# Patient Record
Sex: Female | Born: 1975 | Race: White | Hispanic: No | Marital: Married | Smoking: Never smoker
Health system: Southern US, Community
[De-identification: ages and names within clinical notes are randomized; demographics above are authoritative.]

## PROBLEM LIST (undated history)

## (undated) DIAGNOSIS — R87629 Unspecified abnormal cytological findings in specimens from vagina: Secondary | ICD-10-CM

## (undated) DIAGNOSIS — A630 Anogenital (venereal) warts: Secondary | ICD-10-CM

## (undated) DIAGNOSIS — F32A Depression, unspecified: Secondary | ICD-10-CM

## (undated) DIAGNOSIS — F419 Anxiety disorder, unspecified: Secondary | ICD-10-CM

## (undated) DIAGNOSIS — F329 Major depressive disorder, single episode, unspecified: Secondary | ICD-10-CM

## (undated) DIAGNOSIS — C801 Malignant (primary) neoplasm, unspecified: Secondary | ICD-10-CM

## (undated) DIAGNOSIS — O039 Complete or unspecified spontaneous abortion without complication: Secondary | ICD-10-CM

## (undated) HISTORY — PX: AUGMENTATION MAMMAPLASTY: SUR837

## (undated) HISTORY — PX: BREAST ENHANCEMENT SURGERY: SHX7

## (undated) HISTORY — PX: COSMETIC SURGERY: SHX468

## (undated) HISTORY — PX: ABDOMINAL HYSTERECTOMY: SHX81

## (undated) HISTORY — DX: Complete or unspecified spontaneous abortion without complication: O03.9

## (undated) HISTORY — DX: Unspecified abnormal cytological findings in specimens from vagina: R87.629

---

## 2001-02-27 HISTORY — PX: BREAST ENHANCEMENT SURGERY: SHX7

## 2011-08-23 ENCOUNTER — Ambulatory Visit: Payer: Self-pay | Admitting: Orthopedic Surgery

## 2011-10-26 ENCOUNTER — Ambulatory Visit: Payer: Self-pay

## 2011-10-26 LAB — CBC WITH DIFFERENTIAL/PLATELET
Basophil #: 0 10*3/uL (ref 0.0–0.1)
Basophil %: 0.3 %
Eosinophil #: 0.1 10*3/uL (ref 0.0–0.7)
Eosinophil %: 0.6 %
HCT: 39.2 % (ref 35.0–47.0)
HGB: 12.9 g/dL (ref 12.0–16.0)
Lymphocyte #: 0.7 10*3/uL — ABNORMAL LOW (ref 1.0–3.6)
Lymphocyte %: 5.1 %
MCH: 29.1 pg (ref 26.0–34.0)
MCHC: 32.9 g/dL (ref 32.0–36.0)
MCV: 89 fL (ref 80–100)
Monocyte #: 1.1 x10 3/mm — ABNORMAL HIGH (ref 0.2–0.9)
Monocyte %: 8.5 %
Neutrophil #: 11 10*3/uL — ABNORMAL HIGH (ref 1.4–6.5)
Neutrophil %: 85.5 %
Platelet: 234 10*3/uL (ref 150–440)
RBC: 4.42 10*6/uL (ref 3.80–5.20)
RDW: 13.9 % (ref 11.5–14.5)
WBC: 12.9 10*3/uL — ABNORMAL HIGH (ref 3.6–11.0)

## 2011-10-26 LAB — COMPREHENSIVE METABOLIC PANEL
Albumin: 3.8 g/dL (ref 3.4–5.0)
Alkaline Phosphatase: 88 U/L (ref 50–136)
Anion Gap: 7 (ref 7–16)
BUN: 7 mg/dL (ref 7–18)
Bilirubin,Total: 0.3 mg/dL (ref 0.2–1.0)
Calcium, Total: 9.3 mg/dL (ref 8.5–10.1)
Chloride: 103 mmol/L (ref 98–107)
Co2: 26 mmol/L (ref 21–32)
Creatinine: 1.04 mg/dL (ref 0.60–1.30)
EGFR (African American): >60
EGFR (Non-African Amer.): >60
Glucose: 110 mg/dL — ABNORMAL HIGH (ref 65–99)
Osmolality: 271 (ref 275–301)
Potassium: 3.8 mmol/L (ref 3.5–5.1)
SGOT(AST): 16 U/L (ref 15–37)
SGPT (ALT): 20 U/L (ref 12–78)
Sodium: 136 mmol/L (ref 136–145)
Total Protein: 7.8 g/dL (ref 6.4–8.2)

## 2011-10-26 LAB — URINALYSIS, COMPLETE
Glucose,UR: NEGATIVE mg/dL (ref 0–75)
Nitrite: NEGATIVE
Protein: NEGATIVE
Specific Gravity: 1.01 (ref 1.003–1.030)

## 2011-10-26 LAB — RAPID STREP-A WITH REFLX: Micro Text Report: NEGATIVE

## 2011-10-28 LAB — URINE CULTURE

## 2011-10-28 LAB — BETA STREP CULTURE(ARMC)

## 2013-08-21 ENCOUNTER — Ambulatory Visit: Payer: Self-pay | Admitting: Obstetrics and Gynecology

## 2014-01-30 LAB — HM PAP SMEAR: HM PAP: NEGATIVE

## 2014-10-15 ENCOUNTER — Encounter: Payer: Self-pay | Admitting: *Deleted

## 2014-10-22 ENCOUNTER — Encounter: Payer: Self-pay | Admitting: Obstetrics and Gynecology

## 2014-10-22 ENCOUNTER — Ambulatory Visit (INDEPENDENT_AMBULATORY_CARE_PROVIDER_SITE_OTHER): Payer: Commercial Managed Care - HMO | Admitting: Obstetrics and Gynecology

## 2014-10-22 VITALS — BP 116/85 | HR 78 | Ht 63.0 in | Wt 138.6 lb

## 2014-10-22 DIAGNOSIS — Z30433 Encounter for removal and reinsertion of intrauterine contraceptive device: Secondary | ICD-10-CM | POA: Diagnosis not present

## 2014-10-22 MED ORDER — HYDROCODONE-ACETAMINOPHEN 10-325 MG PO TABS: 1.0000 | ORAL_TABLET | Freq: Four times a day (QID) | ORAL | Status: DC | PRN

## 2014-10-22 MED ORDER — TRAZODONE HCL 100 MG PO TABS: 100.0000 mg | ORAL_TABLET | Freq: Every day | ORAL | Status: DC

## 2014-10-22 NOTE — Patient Instructions (Signed)
Intrauterine Device Insertion, Care After Refer to this sheet in the next few weeks. These instructions provide you with information on caring for yourself after your procedure. Your health care provider may also give you more specific instructions. Your treatment has been planned according to current medical practices, but problems sometimes occur. Call your health care provider if you have any problems or questions after your procedure. WHAT TO EXPECT AFTER THE PROCEDURE Insertion of the IUD may cause some discomfort, such as cramping. The cramping should improve after the IUD is in place. You may have bleeding after the procedure. This is normal. It varies from light spotting for a few days to menstrual-like bleeding. When the IUD is in place, a string will extend past the cervix into the vagina for 1-2 inches. The strings should not bother you or your partner. If they do, talk to your health care provider.  HOME CARE INSTRUCTIONS   Check your intrauterine device (IUD) to make sure it is in place before you resume sexual activity. You should be able to feel the strings. If you cannot feel the strings, something may be wrong. The IUD may have fallen out of the uterus, or the uterus may have been punctured (perforated) during placement. Also, if the strings are getting longer, it may mean that the IUD is being forced out of the uterus. You no longer have full protection from pregnancy if any of these problems occur.  You may resume sexual intercourse if you are not having problems with the IUD. The copper IUD is considered immediately effective, and the hormone IUD works right away if inserted within 7 days of your period starting. You will need to use a backup method of birth control for 7 days if the IUD in inserted at any other time in your cycle.  Continue to check that the IUD is still in place by feeling for the strings after every menstrual period.  You may need to take pain medicine such as  acetaminophen or ibuprofen. Only take medicines as directed by your health care provider. SEEK MEDICAL CARE IF:   You have bleeding that is heavier or lasts longer than a normal menstrual cycle.  You have a fever.  You have increasing cramps or abdominal pain not relieved with medicine.  You have abdominal pain that does not seem to be related to the same area of earlier cramping and pain.  You are lightheaded, unusually weak, or faint.  You have abnormal vaginal discharge or smells.  You have pain during sexual intercourse.  You cannot feel the IUD strings, or the IUD string has gotten longer.  You feel the IUD at the opening of the cervix in the vagina.  You think you are pregnant, or you miss your menstrual period.  The IUD string is hurting your sex partner. MAKE SURE YOU:  Understand these instructions.  Will watch your condition.  Will get help right away if you are not doing well or get worse. Document Released: 10/12/2010 Document Revised: 12/04/2012 Document Reviewed: 08/04/2012 [facility] Patient Information 2015 [place], [place]. This information is not intended to replace advice given to you by your health care provider. Make sure you discuss any questions you have with your health care provider.

## 2014-10-22 NOTE — Progress Notes (Signed)
Dawn Rivera is a 39 y.o. year old No obstetric history on file. Caucasian female who presents for removal and replacement of a Mirena IUD. She was given informed consent for removal and reinsertion of her Mirena. Her Mirena was placed 2011, No LMP recorded. Patient is not currently having periods (Reason: IUD)., and her pregnancy test today was negative.   The risks and benefits of the method and placement have been thouroughly reviewed with the patient and all questions were answered.  Specifically the patient is aware of failure rate of 02/998, expulsion of the IUD and of possible perforation.  The patient is aware of irregular bleeding due to the method and understands the incidence of irregular bleeding diminishes with time.  Signed copy of informed consent in chart.   No LMP recorded. Patient is not currently having periods (Reason: IUD). BP 116/85 mmHg  Pulse 78  Ht 5' 3"  (1.6 m)  Wt 138 lb 9.6 oz (62.869 kg)  BMI 24.56 kg/m2 No results found for this or any previous visit (from the past 24 hour(s)).   Appropriate time out taken. A graves speculum was placed in the vagina.  The cervix was visualized, prepped using Betadine. The strings were visible. They were grasped and the Mirena was easily removed. The cervix was then grasped with a single-tooth tenaculum. The uterus was found to be anteroflexed and it sounded to 8 cm.  Mirena IUD placed per manufacturer's recommendations without complications. The strings were trimmed to 3 cm.  The patient tolerated the procedure well.   Sonogram was performed and the proper placement of the IUD was verified via transvaginal u/s.   The patient was given post procedure instructions, including signs and symptoms of infection and to check for the strings after each menses or each month, and refraining from intercourse or anything in the vagina for 3 days.  She was given a Mirena care card with date Mirena placed, and date Mirena to be  removed.    Melody Valene Bors, CNM

## 2014-10-26 ENCOUNTER — Telehealth: Payer: Self-pay | Admitting: Obstetrics and Gynecology

## 2014-10-26 NOTE — Telephone Encounter (Signed)
PT CALLED AND WANTED TO KNOW IF YOU COULD CHANGE HER ZOLOFT AND TRIAZADON IN THE 90 DAY SUPPLY INSTEAD OF THE 30.

## 2014-10-27 ENCOUNTER — Other Ambulatory Visit: Payer: Self-pay | Admitting: Obstetrics and Gynecology

## 2014-10-27 MED ORDER — SERTRALINE HCL 100 MG PO TABS: 100.0000 mg | ORAL_TABLET | Freq: Two times a day (BID) | ORAL | Status: DC

## 2014-10-27 MED ORDER — TRAZODONE HCL 100 MG PO TABS: 100.0000 mg | ORAL_TABLET | Freq: Every day | ORAL | Status: DC

## 2014-10-27 NOTE — Telephone Encounter (Signed)
Please verify zoloft dose as I know we questioned wether it was truly 250m daily, and I will send in RX, also can do 90 tablets on trazodone as is is a controlled substance.

## 2014-10-27 NOTE — Telephone Encounter (Signed)
Per [facility] pt is taking zoloft 131m 2 qd, also Trazadone [id number]

## 2015-02-02 ENCOUNTER — Other Ambulatory Visit: Payer: Self-pay | Admitting: Obstetrics and Gynecology

## 2015-02-02 ENCOUNTER — Ambulatory Visit (INDEPENDENT_AMBULATORY_CARE_PROVIDER_SITE_OTHER): Payer: Commercial Managed Care - HMO | Admitting: Obstetrics and Gynecology

## 2015-02-02 ENCOUNTER — Encounter: Payer: Self-pay | Admitting: Obstetrics and Gynecology

## 2015-02-02 VITALS — BP 123/91 | HR 78 | Ht 63.0 in | Wt 142.1 lb

## 2015-02-02 DIAGNOSIS — B001 Herpesviral vesicular dermatitis: Secondary | ICD-10-CM

## 2015-02-02 DIAGNOSIS — Z01419 Encounter for gynecological examination (general) (routine) without abnormal findings: Secondary | ICD-10-CM | POA: Diagnosis not present

## 2015-02-02 MED ORDER — INFLUENZA VAC SPLIT QUAD 0.5 ML IM SUSY
0.5000 mL | PREFILLED_SYRINGE | Freq: Once | INTRAMUSCULAR | Status: AC
  Administered 2015-02-02: 0.5 mL via INTRAMUSCULAR

## 2015-02-02 MED ORDER — VALACYCLOVIR HCL 1 G PO TABS: 1000.0000 mg | ORAL_TABLET | Freq: Every day | ORAL | Status: DC

## 2015-02-02 MED ORDER — ACYCLOVIR 5 % EX OINT: 1.0000 "application " | TOPICAL_OINTMENT | CUTANEOUS | Status: DC

## 2015-02-02 NOTE — Progress Notes (Signed)
Subjective:   Dawn Rivera is a 39 y.o. No obstetric history on file. Caucasian female here for a routine well-woman exam.  No LMP recorded. Patient is not currently having periods (Reason: IUD).    Current complaints: occasional fever blisters- needs refill on medications to treat PCP: none       Does need labs  Social History: Sexual: heterosexual Marital Status: married Living situation: with family Occupation: unknown occupation Tobacco/alcohol: no tobacco use Illicit drugs: no history of illicit drug use  The following portions of the patient's history were reviewed and updated as appropriate: allergies, current medications, past family history, past medical history, past social history, past surgical history and problem list.  Past Medical History Past Medical History  Diagnosis Date  . Vaginal Pap smear, abnormal     Past Surgical History Past Surgical History  Procedure Laterality Date  . Breast enhancement surgery  2003    Gynecologic History No obstetric history on file.  No LMP recorded. Patient is not currently having periods (Reason: IUD). Contraception: IUD Last Pap: 2015. Results were: abnormal Last mammogram: 2015. Results were: normal  Obstetric History OB History  No data available    Current Medications Current Outpatient Prescriptions on File Prior to Visit  Medication Sig Dispense Refill  . fluticasone (FLONASE) 50 MCG/ACT nasal spray Place into both nostrils daily.    Marland Kitchen HYDROcodone-acetaminophen (NORCO) 10-325 MG per tablet Take 1 tablet by mouth every 6 (six) hours as needed. 30 tablet 0  . levonorgestrel (MIRENA) 20 MCG/24HR IUD 1 each by Intrauterine route once.    . loratadine (CLARITIN) 10 MG tablet Take 10 mg by mouth daily.    . sertraline (ZOLOFT) 100 MG tablet Take 1 tablet (100 mg total) by mouth 2 (two) times daily. 180 tablet 2  . traZODone (DESYREL) 100 MG tablet Take 1 tablet (100 mg total) by mouth at bedtime. 30 tablet 2    No current facility-administered medications on file prior to visit.    Review of Systems Patient denies any headaches, blurred vision, shortness of breath, chest pain, abdominal pain, problems with bowel movements, urination, or intercourse.  Objective:  BP 123/91 mmHg  Pulse 78  Ht 5' 3"  (1.6 m)  Wt 142 lb 1.6 oz (64.456 kg)  BMI 25.18 kg/m2 recheck BP 123/80 Physical Exam  General:  Well developed, well nourished, no acute distress. She is alert and oriented x3. Skin:  Warm and dry Neck:  Midline trachea, no thyromegaly or nodules Cardiovascular: Regular rate and rhythm, no murmur heard Lungs:  Effort normal, all lung fields clear to auscultation bilaterally Breasts:  No dominant palpable mass, retraction, or nipple discharge, bilateral implants without discharge Abdomen:  Soft, non tender, no hepatosplenomegaly or masses Pelvic:  External genitalia is normal in appearance.  The vagina is normal in appearance. The cervix is bulbous, no CMT. IUD string noted. Thin prep pap is done with HR HPV cotesting. Uterus is felt to be normal size, shape, and contour.  No adnexal masses or tenderness noted. Extremities:  No swelling or varicosities noted Psych:  She has a normal mood and affect  Assessment:   Healthy well-woman exam IUD survellence Anxiety under control with current SSRI HSV 1   Plan:  Pap and labs obtained Flu Vaccine given F/U 1 year for AE, or sooner if needed Mammogram scheduled  Evanee Lubrano Rockney Ghee, CNM

## 2015-02-02 NOTE — Patient Instructions (Signed)
  Place annual gynecologic exam patient instructions here.  Thank you for enrolling in [facility]. Please follow the instructions below to securely access your online medical record. MyChart allows you to send messages to your doctor, view your test results, manage appointments, and more.   How Do I Sign Up? 1. In your Internet browser, go to [employer] and enter [email or web address]. 2. Click on the Sign Up Now link in the Sign In box. You will see the New Member Sign Up page. 3. Enter your MyChart Access Code exactly as it appears below. You will not need to use this code after you've completed the sign-up process. If you do not sign up before the expiration date, you must request a new code.  MyChart Access Code: [email or web address] Expires: 02/26/2015  2:06 AM  4. Enter your Social Security Number ([id number]) and Date of Birth (mm/dd/yyyy) as indicated and click Submit. You will be taken to the next sign-up page. 5. Create a MyChart ID. This will be your MyChart login ID and cannot be changed, so think of one that is secure and easy to remember. 6. Create a MyChart password. You can change your password at any time. 7. Enter your Password Reset Question and Answer. This can be used at a later time if you forget your password.  8. Enter your e-mail address. You will receive e-mail notification when new information is available in [facility]. 9. Click Sign Up. You can now view your medical record.   Additional Information Remember, MyChart is NOT to be used for urgent needs. For medical emergencies, dial 911.

## 2015-02-03 LAB — COMPREHENSIVE METABOLIC PANEL
A/G RATIO: 1.8 (ref 1.1–2.5)
ALBUMIN: 4.9 g/dL (ref 3.5–5.5)
ALT: 19 IU/L (ref 0–32)
AST: 13 IU/L (ref 0–40)
Alkaline Phosphatase: 77 IU/L (ref 39–117)
BUN / CREAT RATIO: 13 (ref 9–23)
BUN: 12 mg/dL (ref 6–24)
Bilirubin Total: 0.2 mg/dL (ref 0.0–1.2)
CALCIUM: 9.8 mg/dL (ref 8.7–10.2)
CO2: 24 mmol/L (ref 18–29)
Chloride: 101 mmol/L (ref 97–106)
Creatinine, Ser: 0.95 mg/dL (ref 0.57–1.00)
GFR, EST AFRICAN AMERICAN: 86 mL/min/{1.73_m2} (ref >59)
GFR, EST NON AFRICAN AMERICAN: 75 mL/min/{1.73_m2} (ref >59)
GLOBULIN, TOTAL: 2.8 g/dL (ref 1.5–4.5)
Glucose: 76 mg/dL (ref 65–99)
POTASSIUM: 4 mmol/L (ref 3.5–5.2)
SODIUM: 141 mmol/L (ref 136–144)
Total Protein: 7.7 g/dL (ref 6.0–8.5)

## 2015-02-03 LAB — LIPID PANEL
CHOL/HDL RATIO: 3.6 ratio (ref 0.0–4.4)
CHOLESTEROL TOTAL: 167 mg/dL (ref 100–199)
HDL: 46 mg/dL (ref >39)
LDL Calculated: 92 mg/dL (ref 0–99)
TRIGLYCERIDES: 144 mg/dL (ref 0–149)
VLDL Cholesterol Cal: 29 mg/dL (ref 5–40)

## 2015-02-03 LAB — CYTOLOGY - PAP

## 2015-02-03 LAB — VITAMIN D 25 HYDROXY (VIT D DEFICIENCY, FRACTURES): Vit D, 25-Hydroxy: 44.1 ng/mL (ref 30.0–100.0)

## 2015-02-08 ENCOUNTER — Other Ambulatory Visit: Payer: Self-pay | Admitting: Obstetrics and Gynecology

## 2015-02-09 ENCOUNTER — Other Ambulatory Visit: Payer: Self-pay | Admitting: Obstetrics and Gynecology

## 2015-02-09 DIAGNOSIS — IMO0002 Reserved for concepts with insufficient information to code with codable children: Secondary | ICD-10-CM

## 2015-02-10 ENCOUNTER — Other Ambulatory Visit: Payer: Self-pay | Admitting: Obstetrics and Gynecology

## 2015-02-10 MED ORDER — HYDROCODONE-ACETAMINOPHEN 10-325 MG PO TABS: 1.0000 | ORAL_TABLET | Freq: Four times a day (QID) | ORAL | Status: DC | PRN

## 2015-02-11 ENCOUNTER — Encounter: Payer: Self-pay | Admitting: Obstetrics and Gynecology

## 2015-02-16 ENCOUNTER — Encounter: Payer: Self-pay | Admitting: Obstetrics and Gynecology

## 2015-03-03 ENCOUNTER — Other Ambulatory Visit: Payer: Self-pay | Admitting: Obstetrics and Gynecology

## 2015-03-05 ENCOUNTER — Encounter: Payer: Commercial Managed Care - HMO | Admitting: Obstetrics and Gynecology

## 2015-03-19 ENCOUNTER — Other Ambulatory Visit: Payer: Self-pay | Admitting: Obstetrics and Gynecology

## 2015-03-19 ENCOUNTER — Ambulatory Visit (INDEPENDENT_AMBULATORY_CARE_PROVIDER_SITE_OTHER): Payer: Managed Care, Other (non HMO) | Admitting: Obstetrics and Gynecology

## 2015-03-19 ENCOUNTER — Encounter: Payer: Self-pay | Admitting: Obstetrics and Gynecology

## 2015-03-19 VITALS — BP 102/80 | HR 88 | Ht 63.0 in | Wt 141.8 lb

## 2015-03-19 DIAGNOSIS — N921 Excessive and frequent menstruation with irregular cycle: Secondary | ICD-10-CM | POA: Diagnosis not present

## 2015-03-19 MED ORDER — VALACYCLOVIR HCL 1 G PO TABS: 1000.0000 mg | ORAL_TABLET | Freq: Every day | ORAL | Status: DC

## 2015-03-19 NOTE — Patient Instructions (Signed)
COLPOSCOPY POST-PROCEDURE INSTRUCTIONS  1. You may take Ibuprofen, Aleve or Tylenol for cramping if needed.  2. If Monsel's solution was used, you will have a black discharge.  3. Light bleeding is normal.  If bleeding is heavier than your period, please call.  4. Put nothing in your vagina until the bleeding or discharge stops (usually 2 or3 days).  5. We will call you within one week with biopsy results or discuss the results at your follow-up appointment if needed

## 2015-03-19 NOTE — Progress Notes (Signed)
Patient ID: Dawn Rivera, female   DOB: [birth year], 40 y.o.   MRN: [id number]  Chief Complaint  Patient presents with  . Colposcopy    abnormal pap, 02/02/15    HPI Dawn Rivera is a 40 y.o. female.  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 Previous colposcopy: CIN 2. Prior cervical treatment: no treatment.  Past Medical History  Diagnosis Date  . Vaginal Pap smear, abnormal     Past Surgical History  Procedure Laterality Date  . Breast enhancement surgery  2003    Family History  Problem Relation Age of Onset  . Cancer Maternal Aunt     breast    Social History Social History  Substance Use Topics  . Smoking status: Never Smoker   . Smokeless tobacco: Never Used  . Alcohol Use: Yes     Comment: occas    No Known Allergies  Current Outpatient Prescriptions  Medication Sig Dispense Refill  . acyclovir ointment (ZOVIRAX) 5 % Apply 1 application topically every 3 (three) hours. 15 g 2  . fluticasone (FLONASE) 50 MCG/ACT nasal spray Place into both nostrils daily.    Marland Kitchen HYDROcodone-acetaminophen (NORCO) 10-325 MG tablet Take 1 tablet by mouth every 6 (six) hours as needed. 30 tablet 0  . levonorgestrel (MIRENA) 20 MCG/24HR IUD 1 each by Intrauterine route once.    . loratadine (CLARITIN) 10 MG tablet Take 10 mg by mouth daily.    . sertraline (ZOLOFT) 100 MG tablet Take 1 tablet (100 mg total) by mouth 2 (two) times daily. 180 tablet 2  . traZODone (DESYREL) 100 MG tablet Take 1 tablet (100 mg total) by mouth at bedtime. 30 tablet 2  . valACYclovir (VALTREX) 1000 MG tablet Take 1 tablet (1,000 mg total) by mouth daily. 90 tablet 2   No current facility-administered medications for this visit.    Review of Systems Review of  Systems  Blood pressure 102/80, pulse 88, height 5' 3"  (1.6 m), weight 141 lb 12.8 oz (64.32 kg).  Physical Exam Physical Exam  Data Reviewed pap  Assessment    Procedure Details  The risks and benefits of the procedure and Written informed consent obtained.  Speculum placed in vagina and excellent visualization of cervix achieved, cervix swabbed x 3 with acetic acid solution.  Specimens: 2 & 4 & 8 o'clock biopsies and ECC- see scan graph  Complications: none.     Plan    Specimens labelled and sent to Pathology. Return to discuss Pathology results in 2 weeks. Discussed possible hysterectomy to treat menorrhagia and cervical dysplasia. Rx sent in for percocet 5/325 #40 for migraines.      Dawn Rivera N Dawn Rivera 03/19/2015, 5:07 PM

## 2015-03-25 ENCOUNTER — Encounter: Payer: Self-pay | Admitting: Obstetrics and Gynecology

## 2015-03-25 ENCOUNTER — Ambulatory Visit (INDEPENDENT_AMBULATORY_CARE_PROVIDER_SITE_OTHER): Payer: Managed Care, Other (non HMO) | Admitting: Obstetrics and Gynecology

## 2015-03-25 VITALS — BP 128/77 | HR 71 | Ht 63.0 in | Wt 142.6 lb

## 2015-03-25 DIAGNOSIS — D069 Carcinoma in situ of cervix, unspecified: Secondary | ICD-10-CM

## 2015-03-25 DIAGNOSIS — N921 Excessive and frequent menstruation with irregular cycle: Secondary | ICD-10-CM | POA: Insufficient documentation

## 2015-03-25 NOTE — Progress Notes (Signed)
Chief complaint: 1.  High-grade dysplasia. 2.  Menorrhagia.  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 Patient is desiring hysterectomy for management of her abnormal uterine bleeding as well as her cervical dysplasia. Currently, Mirena IUD is intact and patient is not having cycles with the device in place.  Pathology:  Cervix 8:00-CIN-2.  Cervix 4:00-CIN-2.  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  ECC-CIN-3/CIS  Findings from colposcopic directed biopsies were reviewed and explained.  ASSESSMENT: 1.  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  PLAN: 1.  LEEP cone biopsy of the cervix. 2.  Probable hysterectomy for management of abnormal uterine bleeding and cervical dysplasia.  Once pathology from LEEP cone biopsy is obtained.  A total of 25 minutes were spent face-to-face with the patient during this encounter and over half of that time involved counseling and coordination of care.  Brayton Mars, MD  Note: This dictation was prepared with Dragon dictation along with smaller phrase technology. Any transcriptional errors that result from this process are unintentional.

## 2015-03-25 NOTE — Patient Instructions (Signed)
1.  LEEP cone biopsy will be scheduled to treat/assess CIN-3/CIS of cervix. 2.  Return in 2 weeks for preop appointment Conization of the Cervix Cervical conization is the cutting (excision) of a cone-shaped portion of the cervix. The procedure is performed through the vagina in either your health care provider's office or an operating room. This procedure is usually done when there is abnormal bleeding from the cervix. It can also be done to evaluate an abnormal Pap test or if an abnormality is seen on the cervix during an exam. The tissue is then examined to see if there are precancerous cells or cancer present.  Conization of the cervix is not done during a menstrual period or pregnancy.  LET [facility] CARE PROVIDER KNOW ABOUT:  Any allergies you have.   All medicines you are taking, including vitamins, herbs, eye drops, creams, and over-the-counter medicines.   Previous problems you or members of your family have had with the use of anesthetics.   Any blood disorders you have.   Previous surgeries you have had.   Medical conditions you have.   Your smoking habits.   The possibility of being pregnant.  RISKS AND COMPLICATIONS  Generally, conization of the cervix is a safe procedure. However, as with any procedure, complications can occur. Possible complications include:  Heavy bleeding several days or weeks after the procedure. Light bleeding or spotting after the procedure is normal.  Infection (rare).  Damage to the cervix or surrounding organs (uncommon).   Problems with the anesthesia.   Increased risk of preterm labor in future pregnancies. BEFORE THE PROCEDURE  Do not eat or drink anything for 6-8 hours before the procedure.   Do not take aspirin or blood thinners for at least a week before the procedure or as directed by your health care provider.   Arrange for someone to take you home after the procedure.  PROCEDURE There are three different methods  to perform conization of the cervix. These include:   The cold knife method. In this method a small cone-shaped sample of tissue is cut out with a knife (scalpel) from the cervical canal and the transformation zone (where the normal cells end and the abnormal cells begin).   The LEEP method. In this method a small cone-shaped sample of tissue is cut out with a thin wire that can burn (cauterize) the cervical tissue with an electrical current.   Laser treatment. In this method a small cone-shaped sample of tissue is cut out and then cauterized with a laser beam to prevent bleeding.  The procedure will be performed as follows:   Depending on the method, you will either be given a medicine to make you sleep (general anesthetic) or a numbing medicine (local anesthetic). A medicine that numbs the cervix (cervical block) may be given.   A lubricated device called a speculum will be inserted into the vagina to spread open the walls of the vagina. This will help your health care provider see the inside of the vagina and cervix better.   The tissue from the cervix will be removed and examined.   The results of the procedure will help your health care provider decide if further treatment is necessary. They will also help your health care provider decide on the best treatment if your results are abnormal. AFTER THE PROCEDURE  If you had a general anesthetic, you may be groggy for 2-3 hours after the procedure.   If you had a local anesthetic, you will  rest at the clinic or hospital until you are stable and feel ready to go home.   Recovery may take up to 3 weeks.   You may have some cramping for about 1 week.   You may have bloody discharge or light bleeding for 1-2 weeks.   You may have black discharge coming from the vagina. This is from the paste used on the cervix to prevent bleeding. This is normal discharge.    This information is not intended to replace advice given to you by  your health care provider. Make sure you discuss any questions you have with your health care provider.   Document Released: 11/23/2004 Document Revised: 02/18/2013 Document Reviewed: 08/09/2012 Elsevier Interactive Patient Education [employer].

## 2015-03-28 ENCOUNTER — Encounter: Payer: Self-pay | Admitting: Obstetrics and Gynecology

## 2015-03-29 ENCOUNTER — Encounter: Payer: Self-pay | Admitting: Obstetrics and Gynecology

## 2015-04-08 ENCOUNTER — Other Ambulatory Visit: Payer: Self-pay | Admitting: *Deleted

## 2015-04-08 ENCOUNTER — Other Ambulatory Visit: Payer: Self-pay | Admitting: Obstetrics and Gynecology

## 2015-04-08 ENCOUNTER — Ambulatory Visit
Admission: RE | Admit: 2015-04-08 | Discharge: 2015-04-08 | Disposition: A | Discharge status: Home / Self Care | Payer: Managed Care, Other (non HMO) | Source: Ambulatory Visit | Attending: Obstetrics and Gynecology | Admitting: Obstetrics and Gynecology

## 2015-04-08 ENCOUNTER — Encounter: Payer: Self-pay | Admitting: Obstetrics and Gynecology

## 2015-04-08 ENCOUNTER — Telehealth: Payer: Self-pay | Admitting: Obstetrics and Gynecology

## 2015-04-08 ENCOUNTER — Ambulatory Visit (INDEPENDENT_AMBULATORY_CARE_PROVIDER_SITE_OTHER): Payer: Managed Care, Other (non HMO) | Admitting: Obstetrics and Gynecology

## 2015-04-08 ENCOUNTER — Encounter
Admission: RE | Admit: 2015-04-08 | Discharge: 2015-04-08 | Disposition: A | Discharge status: Home / Self Care | Payer: Managed Care, Other (non HMO) | Source: Ambulatory Visit | Attending: Obstetrics and Gynecology | Admitting: Obstetrics and Gynecology

## 2015-04-08 VITALS — BP 117/77 | HR 83 | Ht 63.0 in | Wt 143.6 lb

## 2015-04-08 DIAGNOSIS — D069 Carcinoma in situ of cervix, unspecified: Secondary | ICD-10-CM

## 2015-04-08 DIAGNOSIS — Z0189 Encounter for other specified special examinations: Secondary | ICD-10-CM | POA: Insufficient documentation

## 2015-04-08 DIAGNOSIS — Z01419 Encounter for gynecological examination (general) (routine) without abnormal findings: Secondary | ICD-10-CM

## 2015-04-08 DIAGNOSIS — Z1231 Encounter for screening mammogram for malignant neoplasm of breast: Secondary | ICD-10-CM | POA: Diagnosis present

## 2015-04-08 DIAGNOSIS — Z01818 Encounter for other preprocedural examination: Secondary | ICD-10-CM

## 2015-04-08 HISTORY — DX: Anogenital (venereal) warts: A63.0

## 2015-04-08 HISTORY — DX: Major depressive disorder, single episode, unspecified: F32.9

## 2015-04-08 HISTORY — DX: Anxiety disorder, unspecified: F41.9

## 2015-04-08 HISTORY — DX: Depression, unspecified: F32.A

## 2015-04-08 LAB — CBC WITH DIFFERENTIAL/PLATELET
BASOS ABS: 0.1 10*3/uL (ref 0–0.1)
BASOS PCT: 1 %
EOS ABS: 0.4 10*3/uL (ref 0–0.7)
EOS PCT: 4 %
HCT: 42.8 % (ref 35.0–47.0)
Hemoglobin: 13.9 g/dL (ref 12.0–16.0)
Lymphocytes Relative: 21 %
Lymphs Abs: 2.1 10*3/uL (ref 1.0–3.6)
MCH: 28.3 pg (ref 26.0–34.0)
MCHC: 32.6 g/dL (ref 32.0–36.0)
MCV: 87 fL (ref 80.0–100.0)
MONO ABS: 0.6 10*3/uL (ref 0.2–0.9)
Monocytes Relative: 6 %
Neutro Abs: 6.7 10*3/uL — ABNORMAL HIGH (ref 1.4–6.5)
Neutrophils Relative %: 68 %
PLATELETS: 348 10*3/uL (ref 150–440)
RBC: 4.92 MIL/uL (ref 3.80–5.20)
RDW: 13.8 % (ref 11.5–14.5)
WBC: 9.9 10*3/uL (ref 3.6–11.0)

## 2015-04-08 LAB — RAPID HIV SCREEN (HIV 1/2 AB+AG)
HIV 1/2 Antibodies: NONREACTIVE
HIV-1 P24 ANTIGEN - HIV24: NONREACTIVE

## 2015-04-08 MED ORDER — SERTRALINE HCL 100 MG PO TABS: 100.0000 mg | ORAL_TABLET | Freq: Two times a day (BID) | ORAL | Status: DC

## 2015-04-08 NOTE — Pre-Procedure Instructions (Signed)
Order for urine pregnancy will be done as a POCT the day of surgery.  Can we order Pepcid as per anesthesia standing orders instead of citric acid-sodium citrate.  That is not is the pixis in Same Day Surgery.

## 2015-04-08 NOTE — Telephone Encounter (Signed)
preadmit testing called about orders Dr Tennis Must put in for her.  Please call

## 2015-04-08 NOTE — Progress Notes (Signed)
GYN ENCOUNTER NOTE  Subjective:       Dawn Rivera is a 40 y.o. 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  Colposcopy pathology:  Cervix 8:00-CIN-2. Cervix 4:00-CIN-2. 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 Denies any N/V/D/C, abd pain.    Gynecologic History Patient's last menstrual period was 03/17/2015 (approximate). Contraception: IUD   Obstetric History OB History  Gravida Para Term Preterm AB SAB TAB Ectopic Multiple Living  4 3 3  1 1    3     # Outcome Date GA Lbr Len/2nd Weight Sex Delivery Anes PTL Lv  4 Term 2003   7 lb 4.8 oz (3.311 kg) M Vag-Spont   Y  3 SAB 2002          2 Term 1999   7 lb 8 oz (3.402 kg) F Vag-Spont   Y  1 Term 1995   6 lb 4.8 oz (2.858 kg) M Vag-Spont   Y      Past Medical History  Diagnosis Date  . Vaginal Pap smear, abnormal     Past Surgical History  Procedure Laterality Date  . Breast enhancement surgery  2003    Current Outpatient Prescriptions on File Prior to Visit  Medication Sig Dispense Refill  . acyclovir ointment (ZOVIRAX) 5 % Apply 1 application topically every 3 (three) hours. 15 g 2  . fluticasone (FLONASE) 50 MCG/ACT nasal spray Place into both nostrils daily.    Marland Kitchen levonorgestrel (MIRENA) 20 MCG/24HR IUD 1 each by Intrauterine route once.    . loratadine (CLARITIN) 10 MG tablet Take 10 mg by mouth daily.    . Multiple Vitamins-Minerals (MULTIVITAMIN WITH MINERALS) tablet Take 1 tablet by mouth daily.    Marland Kitchen oxyCODONE-acetaminophen (PERCOCET/ROXICET) 5-325 MG tablet     . sertraline (ZOLOFT) 100 MG tablet Take 1 tablet (100 mg total) by mouth 2 (two) times daily. 180 tablet 2  . traZODone (DESYREL) 100 MG tablet Take 1 tablet (100 mg total) by mouth at bedtime. 30 tablet 2  . valACYclovir (VALTREX) 1000 MG tablet Take 1 tablet (1,000 mg total) by mouth daily. 90 tablet 2  . XANAX 1 MG tablet      No current facility-administered medications  on file prior to visit.    No Known Allergies  Social History   Social History  . Marital Status: Married    Spouse Name: N/A  . Number of Children: N/A  . Years of Education: N/A   Occupational History  . Not on file.   Social History Main Topics  . Smoking status: Never Smoker   . Smokeless tobacco: Never Used  . Alcohol Use: Yes     Comment: occas  . Drug Use: No  . Sexual Activity: Yes    Birth Control/ Protection: IUD   Other Topics Concern  . Not on file   Social History Narrative    Family History  Problem Relation Age of Onset  . Breast cancer Maternal Aunt   . Heart disease Mother   . Colon cancer Maternal Grandmother   . Ovarian cancer Maternal Grandfather   . Diabetes Neg Hx     The following portions of the patient's history were reviewed and updated as appropriate: allergies, current medications, past family history, past medical history, past social history, past surgical history and problem list.  Review of Systems Review of Systems - General ROS: negative for - chills, fatigue, fever, hot flashes, malaise or night  sweats Hematological and Lymphatic ROS: negative for - bleeding problems or swollen lymph nodes Gastrointestinal ROS: negative for - abdominal pain, blood in stools, change in bowel habits and nausea/vomiting Musculoskeletal ROS: negative for - joint pain, muscle pain or muscular weakness Genito-Urinary ROS: negative for - change in menstrual cycle, dysmenorrhea, dyspareunia, dysuria, genital discharge, genital ulcers, hematuria, incontinence, irregular/heavy menses, nocturia or pelvic pain  Objective:   BP 117/77 mmHg  Pulse 83  Ht 5' 3"  (1.6 m)  Wt 143 lb 9.6 oz (65.137 kg)  BMI 25.44 kg/m2  LMP 03/17/2015 (Approximate) CONSTITUTIONAL: Well-developed, well-nourished female in no acute distress.  HENT:  Normocephalic, atraumatic.  NECK: Normal range of motion, supple, no masses.  Normal thyroid.  SKIN: Skin is warm and dry. No  rash noted. Not diaphoretic. No erythema. No pallor. [place]: Alert and oriented to person, place, and time.  PSYCHIATRIC: Normal mood and affect. Normal behavior. Normal judgment and thought content. CARDIOVASCULAR: Normal S1, S2. No m/g/r RESPIRATORY: Clear to auscultation bilaterally.  BREASTS: Not Examined ABDOMEN: Deferred till OR PELVIC: Not Examined  MUSCULOSKELETAL: Not Examined      Assessment:   1. 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  2. H/o Abnormal uterine bleeding - Patient desires hysterectomy as definitve treatment   Plan:  1.  LEEP cone biopsy. 2.  Removal of Mirena IUD  Preop counseling: Patient is to undergo LEEP cone biopsy for CIN 3/CDIS on ECC specimen.  She is understanding of the planned procedure and is aware of and is accepting of all surgical risks which include but are not limited to bleeding, infection, pelvic organ injury with need for repair, blood clot disorders, anesthesia risks, etc.  All questions have been answered.  Informed consent is given.  Patient is ready and willing to proceed with surgery as scheduled. Patient is to have Mirena IUD removed at the time of the LEEP.  Cathlean Sauer, PA-S Brayton Mars, MD   I have seen, interviewed, and examined the patient in conjunction with the [facility].A. student and affirm the diagnosis and management plan. Martin A. DeFrancesco, MD, FACOG   Note: This dictation was prepared with Dragon dictation along with smaller phrase technology. Any transcriptional errors that result from this process are unintentional.

## 2015-04-08 NOTE — Patient Instructions (Signed)
  Your procedure is scheduled on: 04/12/15 Report to Day Surgery. To find out your arrival time please call [phone] between 1PM - 3PM on 03/09/15  Remember: Instructions that are not followed completely may result in serious medical risk, up to and including death, or upon the discretion of your surgeon and anesthesiologist your surgery may need to be rescheduled.    __x__ 1. Do not eat food or drink liquids after midnight. No gum chewing or hard candies.     __x__ 2. No Alcohol for 24 hours before or after surgery.   ____ 3. Bring all medications with you on the day of surgery if instructed.    __x__ 4. Notify your doctor if there is any change in your medical condition     (cold, fever, infections).     Do not wear jewelry, make-up, hairpins, clips or nail polish.  Do not wear lotions, powders, or perfumes. You may wear deodorant.  Do not shave 48 hours prior to surgery. Men may shave face and neck.  Do not bring valuables to the hospital.    [facility] is not responsible for any belongings or valuables.               Contacts, dentures or bridgework may not be worn into surgery.  Leave your suitcase in the car. After surgery it may be brought to your room.  For patients admitted to the hospital, discharge time is determined by your                treatment team.   Patients discharged the day of surgery will not be allowed to drive home.   Please read over the following fact sheets that you were given:   Surgical Site Infection Prevention   ____ Take these medicines the morning of surgery with A SIP OF WATER:    1.   2.   3.   4.  5.  6.  ____ Fleet Enema (as directed)   ____ Use CHG Soap as directed  ____ Use inhalers on the day of surgery  ____ Stop metformin 2 days prior to surgery    ____ Take 1/2 of usual insulin dose the night before surgery and none on the morning of surgery.   ____ Stop Coumadin/Plavix/aspirin on  __x__ Stop Anti-inflammatories on  tylenol only until surgery   ___x_ Stop supplements until after surgery.    ____ Bring C-Pap to the hospital.

## 2015-04-09 LAB — RPR: RPR Ser Ql: NONREACTIVE

## 2015-04-11 NOTE — Patient Instructions (Signed)
1.  Return in 1 week for postop check

## 2015-04-11 NOTE — Progress Notes (Signed)
GYN ENCOUNTER NOTE  Subjective:       Dawn Rivera is a 40 y.o. 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  Colposcopy pathology:  Cervix 8:00-CIN-2. Cervix 4:00-CIN-2. 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 Denies any N/V/D/C, abd pain.    Gynecologic History Patient's last menstrual period was 03/17/2015 (approximate). Contraception: IUD   Obstetric History OB History  Gravida Para Term Preterm AB SAB TAB Ectopic Multiple Living  4 3 3  1 1    3     # Outcome Date GA Lbr Len/2nd Weight Sex Delivery Anes PTL Lv  4 Term 2003   7 lb 4.8 oz (3.311 kg) M Vag-Spont   Y  3 SAB 2002          2 Term 1999   7 lb 8 oz (3.402 kg) F Vag-Spont   Y  1 Term 1995   6 lb 4.8 oz (2.858 kg) M Vag-Spont   Y      Past Medical History  Diagnosis Date  . Vaginal Pap smear, abnormal   . Depression   . Anxiety   . HPV (human papilloma virus) anogenital infection     Past Surgical History  Procedure Laterality Date  . Breast enhancement surgery  2003  . Augmentation mammaplasty Bilateral     breast implants    No current facility-administered medications on file prior to encounter.   Current Outpatient Prescriptions on File Prior to Encounter  Medication Sig Dispense Refill  . acyclovir ointment (ZOVIRAX) 5 % Apply 1 application topically every 3 (three) hours. 15 g 2  . fluticasone (FLONASE) 50 MCG/ACT nasal spray Place into both nostrils daily.    Marland Kitchen levonorgestrel (MIRENA) 20 MCG/24HR IUD 1 each by Intrauterine route once.    . loratadine (CLARITIN) 10 MG tablet Take 10 mg by mouth daily.    . Multiple Vitamins-Minerals (MULTIVITAMIN WITH MINERALS) tablet Take 1 tablet by mouth daily.    Marland Kitchen oxyCODONE-acetaminophen (PERCOCET/ROXICET) 5-325 MG tablet     . traZODone (DESYREL) 100 MG tablet Take 1 tablet (100 mg total) by mouth at bedtime. 30 tablet 2  . valACYclovir (VALTREX) 1000 MG tablet Take 1 tablet (1,000  mg total) by mouth daily. 90 tablet 2  . XANAX 1 MG tablet       No Known Allergies  Social History   Social History  . Marital Status: Married    Spouse Name: N/A  . Number of Children: N/A  . Years of Education: N/A   Occupational History  . Not on file.   Social History Main Topics  . Smoking status: Never Smoker   . Smokeless tobacco: Never Used  . Alcohol Use: Yes     Comment: occas  . Drug Use: No  . Sexual Activity: Yes    Birth Control/ Protection: IUD   Other Topics Concern  . Not on file   Social History Narrative    Family History  Problem Relation Age of Onset  . Breast cancer Maternal Aunt   . Heart disease Mother   . Colon cancer Maternal Grandmother   . Ovarian cancer Maternal Grandfather   . Diabetes Neg Hx     The following portions of the patient's history were reviewed and updated as appropriate: allergies, current medications, past family history, past medical history, past social history, past surgical history and problem list.  Review of Systems Review of Systems - General ROS: negative for - chills, fatigue,  fever, hot flashes, malaise or night sweats Hematological and Lymphatic ROS: negative for - bleeding problems or swollen lymph nodes Gastrointestinal ROS: negative for - abdominal pain, blood in stools, change in bowel habits and nausea/vomiting Musculoskeletal ROS: negative for - joint pain, muscle pain or muscular weakness Genito-Urinary ROS: negative for - change in menstrual cycle, dysmenorrhea, dyspareunia, dysuria, genital discharge, genital ulcers, hematuria, incontinence, irregular/heavy menses, nocturia or pelvic pain  Objective:   LMP 03/17/2015 (Approximate) CONSTITUTIONAL: Well-developed, well-nourished female in no acute distress.  HENT:  Normocephalic, atraumatic.  NECK: Normal range of motion, supple, no masses.  Normal thyroid.  SKIN: Skin is warm and dry. No rash noted. Not diaphoretic. No erythema. No  pallor. [place]: Alert and oriented to person, place, and time.  PSYCHIATRIC: Normal mood and affect. Normal behavior. Normal judgment and thought content. CARDIOVASCULAR: Normal S1, S2. No m/g/r RESPIRATORY: Clear to auscultation bilaterally.  BREASTS: Not Examined ABDOMEN: Deferred till OR PELVIC: Not Examined  MUSCULOSKELETAL: Not Examined      Assessment:   1. 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  2. H/o Abnormal uterine bleeding - Patient desires hysterectomy as definitve treatment   Plan:  1.  LEEP cone biopsy. 2.  Removal of Mirena IUD  Preop counseling: Patient is to undergo LEEP cone biopsy for CIN 3/CDIS on ECC specimen.  She is understanding of the planned procedure and is aware of and is accepting of all surgical risks which include but are not limited to bleeding, infection, pelvic organ injury with need for repair, blood clot disorders, anesthesia risks, etc.  All questions have been answered.  Informed consent is given.  Patient is ready and willing to proceed with surgery as scheduled. Patient is to have Mirena IUD removed at the time of the LEEP.  Cathlean Sauer, PA-S Brayton Mars, MD   I have seen, interviewed, and examined the patient in conjunction with the [facility].A. student and affirm the diagnosis and management plan. Davyon Fisch A. Langston [place], MD, FACOG   Note: This dictation was prepared with Dragon dictation along with smaller phrase technology. Any transcriptional errors that result from this process are unintentional.

## 2015-04-12 ENCOUNTER — Ambulatory Visit
Admission: RE | Admit: 2015-04-12 | Discharge: 2015-04-12 | Disposition: A | Discharge status: Home / Self Care | Payer: Managed Care, Other (non HMO) | Source: Ambulatory Visit | Attending: Obstetrics and Gynecology | Admitting: Obstetrics and Gynecology

## 2015-04-12 ENCOUNTER — Encounter
Admission: RE | Disposition: A | Discharge status: Home / Self Care | Payer: Self-pay | Source: Ambulatory Visit | Attending: Obstetrics and Gynecology

## 2015-04-12 ENCOUNTER — Ambulatory Visit: Payer: Managed Care, Other (non HMO) | Admitting: Anesthesiology

## 2015-04-12 ENCOUNTER — Encounter: Payer: Self-pay | Admitting: *Deleted

## 2015-04-12 DIAGNOSIS — Z8041 Family history of malignant neoplasm of ovary: Secondary | ICD-10-CM | POA: Diagnosis not present

## 2015-04-12 DIAGNOSIS — Z30432 Encounter for removal of intrauterine contraceptive device: Secondary | ICD-10-CM | POA: Diagnosis not present

## 2015-04-12 DIAGNOSIS — Z9882 Breast implant status: Secondary | ICD-10-CM | POA: Diagnosis not present

## 2015-04-12 DIAGNOSIS — Z79899 Other long term (current) drug therapy: Secondary | ICD-10-CM | POA: Diagnosis not present

## 2015-04-12 DIAGNOSIS — Z79891 Long term (current) use of opiate analgesic: Secondary | ICD-10-CM | POA: Insufficient documentation

## 2015-04-12 DIAGNOSIS — D061 Carcinoma in situ of exocervix: Secondary | ICD-10-CM | POA: Insufficient documentation

## 2015-04-12 DIAGNOSIS — F419 Anxiety disorder, unspecified: Secondary | ICD-10-CM | POA: Insufficient documentation

## 2015-04-12 DIAGNOSIS — N939 Abnormal uterine and vaginal bleeding, unspecified: Secondary | ICD-10-CM | POA: Insufficient documentation

## 2015-04-12 DIAGNOSIS — Z800 Family history of malignant neoplasm of digestive organs: Secondary | ICD-10-CM | POA: Insufficient documentation

## 2015-04-12 DIAGNOSIS — Z7951 Long term (current) use of inhaled steroids: Secondary | ICD-10-CM | POA: Diagnosis not present

## 2015-04-12 DIAGNOSIS — D069 Carcinoma in situ of cervix, unspecified: Secondary | ICD-10-CM | POA: Diagnosis not present

## 2015-04-12 DIAGNOSIS — N920 Excessive and frequent menstruation with regular cycle: Secondary | ICD-10-CM | POA: Insufficient documentation

## 2015-04-12 DIAGNOSIS — Z803 Family history of malignant neoplasm of breast: Secondary | ICD-10-CM | POA: Insufficient documentation

## 2015-04-12 DIAGNOSIS — A630 Anogenital (venereal) warts: Secondary | ICD-10-CM | POA: Insufficient documentation

## 2015-04-12 DIAGNOSIS — F329 Major depressive disorder, single episode, unspecified: Secondary | ICD-10-CM | POA: Diagnosis not present

## 2015-04-12 DIAGNOSIS — R87613 High grade squamous intraepithelial lesion on cytologic smear of cervix (HGSIL): Secondary | ICD-10-CM | POA: Diagnosis present

## 2015-04-12 DIAGNOSIS — Z86001 Personal history of in-situ neoplasm of cervix uteri: Secondary | ICD-10-CM | POA: Diagnosis not present

## 2015-04-12 HISTORY — PX: LEEP: SHX91

## 2015-04-12 LAB — POCT PREGNANCY, URINE: PREG TEST UR: NEGATIVE

## 2015-04-12 SURGERY — LEEP (LOOP ELECTROSURGICAL EXCISION PROCEDURE)
Anesthesia: General

## 2015-04-12 MED ORDER — PROPOFOL 10 MG/ML IV BOLUS
INTRAVENOUS | Status: DC | PRN
  Administered 2015-04-12: 110 mg via INTRAVENOUS

## 2015-04-12 MED ORDER — FENTANYL CITRATE (PF) 100 MCG/2ML IJ SOLN
INTRAMUSCULAR | Status: DC | PRN
  Administered 2015-04-12: 100 ug via INTRAVENOUS

## 2015-04-12 MED ORDER — IODINE STRONG (LUGOLS) 5 % PO SOLN
ORAL | Status: DC | PRN
  Administered 2015-04-12: 5 mL via ORAL

## 2015-04-12 MED ORDER — FAMOTIDINE 20 MG PO TABS
20.0000 mg | ORAL_TABLET | Freq: Once | ORAL | Status: AC
  Administered 2015-04-12: 20 mg via ORAL

## 2015-04-12 MED ORDER — LIDOCAINE-EPINEPHRINE 1 %-1:100000 IJ SOLN
INTRAMUSCULAR | Status: DC | PRN
  Administered 2015-04-12: 14 mL

## 2015-04-12 MED ORDER — IBUPROFEN 800 MG PO TABS: 800.0000 mg | ORAL_TABLET | Freq: Three times a day (TID) | ORAL | Status: DC

## 2015-04-12 MED ORDER — DEXAMETHASONE SODIUM PHOSPHATE 10 MG/ML IJ SOLN
INTRAMUSCULAR | Status: DC | PRN
  Administered 2015-04-12: 5 mg via INTRAVENOUS

## 2015-04-12 MED ORDER — LIDOCAINE-EPINEPHRINE 1 %-1:100000 IJ SOLN
INTRAMUSCULAR | Status: AC
  Filled 2015-04-12: qty 1

## 2015-04-12 MED ORDER — FERRIC SUBSULFATE 259 MG/GM EX SOLN
CUTANEOUS | Status: AC
  Filled 2015-04-12: qty 8

## 2015-04-12 MED ORDER — ONDANSETRON HCL 4 MG/2ML IJ SOLN
INTRAMUSCULAR | Status: DC | PRN
  Administered 2015-04-12: 4 mg via INTRAVENOUS

## 2015-04-12 MED ORDER — FENTANYL CITRATE (PF) 100 MCG/2ML IJ SOLN
INTRAMUSCULAR | Status: AC
  Filled 2015-04-12: qty 2

## 2015-04-12 MED ORDER — IODINE STRONG (LUGOLS) 5 % PO SOLN
ORAL | Status: AC
  Filled 2015-04-12: qty 1

## 2015-04-12 MED ORDER — LIDOCAINE HCL (CARDIAC) 20 MG/ML IV SOLN
INTRAVENOUS | Status: DC | PRN
  Administered 2015-04-12: 60 mg via INTRAVENOUS

## 2015-04-12 MED ORDER — OXYCODONE-ACETAMINOPHEN 5-325 MG PO TABS: 1.0000 | ORAL_TABLET | ORAL | Status: DC | PRN

## 2015-04-12 MED ORDER — FAMOTIDINE 20 MG PO TABS
ORAL_TABLET | ORAL | Status: AC
  Filled 2015-04-12: qty 1

## 2015-04-12 MED ORDER — ONDANSETRON HCL 4 MG/2ML IJ SOLN: 4.0000 mg | Freq: Once | INTRAMUSCULAR | Status: DC | PRN

## 2015-04-12 MED ORDER — MIDAZOLAM HCL 2 MG/2ML IJ SOLN
INTRAMUSCULAR | Status: DC | PRN
  Administered 2015-04-12: 2 mg via INTRAVENOUS

## 2015-04-12 MED ORDER — FENTANYL CITRATE (PF) 100 MCG/2ML IJ SOLN
25.0000 ug | INTRAMUSCULAR | Status: DC | PRN
  Administered 2015-04-12: 25 ug via INTRAVENOUS

## 2015-04-12 MED ORDER — LACTATED RINGERS IV SOLN
INTRAVENOUS | Status: DC
  Administered 2015-04-12 (×2): via INTRAVENOUS

## 2015-04-12 SURGICAL SUPPLY — 37 items
APPLICATOR COTTON TIP 6IN STRL (MISCELLANEOUS) ×2 IMPLANT
BLADE SURG SZ10 CARB STEEL (BLADE) ×2 IMPLANT
CANISTER SUCT 1200ML W/VALVE (MISCELLANEOUS) ×2 IMPLANT
CATH ROBINSON RED A/P 16FR (CATHETERS) ×2 IMPLANT
COUNTER NEEDLE 20/40 LG (NEEDLE) ×2 IMPLANT
DEPRESSOR TONGUE BLADE STERILE (MISCELLANEOUS) ×2 IMPLANT
DRAPE UNDER BUTTOCK W/FLU (DRAPES) ×2 IMPLANT
DRSG TELFA 3X8 NADH (GAUZE/BANDAGES/DRESSINGS) ×2 IMPLANT
ELECT LEEP BALL 5MM 12CM (MISCELLANEOUS) ×2
ELECT LEEP LOOP 20X10 R2010 (MISCELLANEOUS) ×2
ELECT LOOP 1.0X1.0CM R1010 (MISCELLANEOUS) ×2
ELECT REM PT RETURN 9FT ADLT (ELECTROSURGICAL) ×2
ELECTRODE LEEP BALL 5MM 12CM (MISCELLANEOUS) ×1 IMPLANT
ELECTRODE LEP LOOP 20X10 R2010 (MISCELLANEOUS) ×1 IMPLANT
ELECTRODE LOOP 1.0X1.0CM R1010 (MISCELLANEOUS) ×1 IMPLANT
ELECTRODE REM PT RTRN 9FT ADLT (ELECTROSURGICAL) ×1 IMPLANT
GLOVE BIO SURGEON STRL SZ8 (GLOVE) ×2 IMPLANT
GOWN STRL REUS W/ TWL LRG LVL3 (GOWN DISPOSABLE) ×2 IMPLANT
GOWN STRL REUS W/ TWL XL LVL3 (GOWN DISPOSABLE) ×1 IMPLANT
GOWN STRL REUS W/TWL LRG LVL3 (GOWN DISPOSABLE) ×2
GOWN STRL REUS W/TWL XL LVL3 (GOWN DISPOSABLE) ×1
HANDLE YANKAUER SUCT BULB TIP (MISCELLANEOUS) ×2 IMPLANT
KIT RM TURNOVER CYSTO AR (KITS) ×2 IMPLANT
LABEL OR SOLS (LABEL) ×2 IMPLANT
NEEDLE SPNL 22GX3.5 QUINCKE BK (NEEDLE) ×4 IMPLANT
PACK DNC HYST (MISCELLANEOUS) ×2 IMPLANT
PAD OB MATERNITY 4.3X12.25 (PERSONAL CARE ITEMS) ×2 IMPLANT
PAD PREP 24X41 OB/GYN DISP (PERSONAL CARE ITEMS) ×2 IMPLANT
PENCIL ELECTRO HAND CTR (MISCELLANEOUS) ×2 IMPLANT
SOL PREP PVP 2OZ (MISCELLANEOUS) ×2
SOLUTION PREP PVP 2OZ (MISCELLANEOUS) ×1 IMPLANT
STAPLER INSORB 30 2030 C-SECTI (MISCELLANEOUS) ×2 IMPLANT
STRAW SMOKE EVAC LEEP 6150 NON (MISCELLANEOUS) ×2 IMPLANT
SUT SILK 2 0 SH (SUTURE) ×2 IMPLANT
SUT VIC AB 2-0 CT1 36 (SUTURE) ×2 IMPLANT
SYRINGE 10CC LL (SYRINGE) ×2 IMPLANT
TOWEL OR 17X26 4PK STRL BLUE (TOWEL DISPOSABLE) ×2 IMPLANT

## 2015-04-12 NOTE — Discharge Instructions (Signed)

## 2015-04-12 NOTE — Anesthesia Preprocedure Evaluation (Signed)
Anesthesia Evaluation  Patient identified by MRN, date of birth, ID band Patient awake    Reviewed: Allergy & Precautions, H&P , NPO status , Patient's Chart, lab work & pertinent test results, reviewed documented beta blocker date and time   Airway Mallampati: II  TM Distance: >3 FB Neck ROM: full    Dental  (+) Teeth Intact   Pulmonary neg pulmonary ROS,    Pulmonary exam normal        Cardiovascular Exercise Tolerance: Good negative cardio ROS Normal cardiovascular exam Rate:Normal     Neuro/Psych negative neurological ROS  negative psych ROS   GI/Hepatic negative GI ROS, Neg liver ROS,   Endo/Other  negative endocrine ROS  Renal/GU negative Renal ROS  negative genitourinary   Musculoskeletal   Abdominal   Peds  Hematology negative hematology ROS (+)   Anesthesia Other Findings   Reproductive/Obstetrics negative OB ROS                             Anesthesia Physical Anesthesia Plan  ASA: II  Anesthesia Plan: General LMA   Post-op Pain Management:    Induction:   Airway Management Planned:   Additional Equipment:   Intra-op Plan:   Post-operative Plan:   Informed Consent: I have reviewed the patients History and Physical, chart, labs and discussed the procedure including the risks, benefits and alternatives for the proposed anesthesia with the patient or authorized representative who has indicated his/her understanding and acceptance.     Plan Discussed with: CRNA  Anesthesia Plan Comments:         Anesthesia Quick Evaluation

## 2015-04-12 NOTE — Anesthesia Procedure Notes (Signed)
Procedure Name: LMA Insertion Performed by: Jenetta Downer Pre-anesthesia Checklist: Patient identified, Emergency Drugs available, Suction available, Patient being monitored and Timeout performed Oxygen Delivery Method: Circle system utilized Preoxygenation: Pre-oxygenation with 100% oxygen Intubation Type: IV induction Ventilation: Mask ventilation without difficulty LMA: LMA inserted LMA Size: 3.0 Number of attempts: 1 Placement Confirmation: breath sounds checked- equal and bilateral and positive ETCO2

## 2015-04-12 NOTE — H&P (View-Only) (Signed)
GYN ENCOUNTER NOTE  Subjective:       Dawn Rivera is a 40 y.o. 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  Colposcopy pathology:  Cervix 8:00-CIN-2. Cervix 4:00-CIN-2. 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 Denies any N/V/D/C, abd pain.    Gynecologic History Patient's last menstrual period was 03/17/2015 (approximate). Contraception: IUD   Obstetric History OB History  Gravida Para Term Preterm AB SAB TAB Ectopic Multiple Living  4 3 3  1 1    3     # Outcome Date GA Lbr Len/2nd Weight Sex Delivery Anes PTL Lv  4 Term 2003   7 lb 4.8 oz (3.311 kg) M Vag-Spont   Y  3 SAB 2002          2 Term 1999   7 lb 8 oz (3.402 kg) F Vag-Spont   Y  1 Term 1995   6 lb 4.8 oz (2.858 kg) M Vag-Spont   Y      Past Medical History  Diagnosis Date  . Vaginal Pap smear, abnormal   . Depression   . Anxiety   . HPV (human papilloma virus) anogenital infection     Past Surgical History  Procedure Laterality Date  . Breast enhancement surgery  2003  . Augmentation mammaplasty Bilateral     breast implants    No current facility-administered medications on file prior to encounter.   Current Outpatient Prescriptions on File Prior to Encounter  Medication Sig Dispense Refill  . acyclovir ointment (ZOVIRAX) 5 % Apply 1 application topically every 3 (three) hours. 15 g 2  . fluticasone (FLONASE) 50 MCG/ACT nasal spray Place into both nostrils daily.    Marland Kitchen levonorgestrel (MIRENA) 20 MCG/24HR IUD 1 each by Intrauterine route once.    . loratadine (CLARITIN) 10 MG tablet Take 10 mg by mouth daily.    . Multiple Vitamins-Minerals (MULTIVITAMIN WITH MINERALS) tablet Take 1 tablet by mouth daily.    Marland Kitchen oxyCODONE-acetaminophen (PERCOCET/ROXICET) 5-325 MG tablet     . traZODone (DESYREL) 100 MG tablet Take 1 tablet (100 mg total) by mouth at bedtime. 30 tablet 2  . valACYclovir (VALTREX) 1000 MG tablet Take 1 tablet (1,000  mg total) by mouth daily. 90 tablet 2  . XANAX 1 MG tablet       No Known Allergies  Social History   Social History  . Marital Status: Married    Spouse Name: N/A  . Number of Children: N/A  . Years of Education: N/A   Occupational History  . Not on file.   Social History Main Topics  . Smoking status: Never Smoker   . Smokeless tobacco: Never Used  . Alcohol Use: Yes     Comment: occas  . Drug Use: No  . Sexual Activity: Yes    Birth Control/ Protection: IUD   Other Topics Concern  . Not on file   Social History Narrative    Family History  Problem Relation Age of Onset  . Breast cancer Maternal Aunt   . Heart disease Mother   . Colon cancer Maternal Grandmother   . Ovarian cancer Maternal Grandfather   . Diabetes Neg Hx     The following portions of the patient's history were reviewed and updated as appropriate: allergies, current medications, past family history, past medical history, past social history, past surgical history and problem list.  Review of Systems Review of Systems - General ROS: negative for - chills, fatigue,  fever, hot flashes, malaise or night sweats Hematological and Lymphatic ROS: negative for - bleeding problems or swollen lymph nodes Gastrointestinal ROS: negative for - abdominal pain, blood in stools, change in bowel habits and nausea/vomiting Musculoskeletal ROS: negative for - joint pain, muscle pain or muscular weakness Genito-Urinary ROS: negative for - change in menstrual cycle, dysmenorrhea, dyspareunia, dysuria, genital discharge, genital ulcers, hematuria, incontinence, irregular/heavy menses, nocturia or pelvic pain  Objective:   LMP 03/17/2015 (Approximate) CONSTITUTIONAL: Well-developed, well-nourished female in no acute distress.  HENT:  Normocephalic, atraumatic.  NECK: Normal range of motion, supple, no masses.  Normal thyroid.  SKIN: Skin is warm and dry. No rash noted. Not diaphoretic. No erythema. No  pallor. [place]: Alert and oriented to person, place, and time.  PSYCHIATRIC: Normal mood and affect. Normal behavior. Normal judgment and thought content. CARDIOVASCULAR: Normal S1, S2. No m/g/r RESPIRATORY: Clear to auscultation bilaterally.  BREASTS: Not Examined ABDOMEN: Deferred till OR PELVIC: Not Examined  MUSCULOSKELETAL: Not Examined      Assessment:   1. 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  2. H/o Abnormal uterine bleeding - Patient desires hysterectomy as definitve treatment   Plan:  1.  LEEP cone biopsy. 2.  Removal of Mirena IUD  Preop counseling: Patient is to undergo LEEP cone biopsy for CIN 3/CDIS on ECC specimen.  She is understanding of the planned procedure and is aware of and is accepting of all surgical risks which include but are not limited to bleeding, infection, pelvic organ injury with need for repair, blood clot disorders, anesthesia risks, etc.  All questions have been answered.  Informed consent is given.  Patient is ready and willing to proceed with surgery as scheduled. Patient is to have Mirena IUD removed at the time of the LEEP.  Cathlean Sauer, PA-S Brayton Mars, MD   I have seen, interviewed, and examined the patient in conjunction with the [facility].A. student and affirm the diagnosis and management plan. Cathren Sween A. Arael Piccione, MD, FACOG   Note: This dictation was prepared with Dragon dictation along with smaller phrase technology. Any transcriptional errors that result from this process are unintentional.

## 2015-04-12 NOTE — Op Note (Signed)
OPERATIVE NOTE:  Dawn Rivera PROCEDURE DATE: 04/12/2015   PREOPERATIVE DIAGNOSIS:  1. 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 IUD removal; IUD removal  POSTOPERATIVE DIAGNOSIS: Same  PROCEDURE: LEEP cone biopsy; IUD removal SURGEON:  Brayton Mars, MD ASSISTANTS: None ANESTHESIA: General INDICATIONS: 40 y.o. [id number]  with long history of abnormal uterine bleeding, controlled with Mirena IUD, and history of CIN-3/CIS of the cervix, presents for surgical assessment.  FINDINGS:   1. Gynecoid pelvis; good support; patient is an LAVH/TVH candidate 2. Lugol's nonstaining is noted at 5:00 through 6:00 through 7:00 of the ectocervix   I/O's: Total I/O In: 650 [I.V.:650] Out: 700 [Urine:700] COUNTS:  YES SPECIMENS:  1. Ectocervical LEEP, tagged at 12:00 2. Endocervical LEEP 3. Post LEEP ECC  ANTIBIOTIC PROPHYLAXIS:N/A COMPLICATIONS: None immediate  PROCEDURE IN DETAIL:  LEEP cone biopsy Patient was brought to the operating room where she was placed in supine position. General anesthesia with LMA technique was induced without difficulty. Patient was placed in the dorsal lithotomy position using candycane stirrups. A Betadine external perineal prep and drape was performed in standard fashion. Red Robison catheter was used to drain 350 mL of urine from the bladder. A COATED Graves speculum was placed in the vagina. The vagina and cervix were painted with Lugol's solution with the above-noted nonstaining pattern being identified. The cervix was circumferentially injected with 1% lidocaine with 1 100,000 epinephrine, using a total of 15 cc. The LEEP cone biopsy was performed in standard fashion.  Ectocervical LEEP was performed with a wide loop. The endocervical LEEP was performed with a narrow loop. Post LEEP ECC was performed with a serrated. Rollerball cautery was used to coagulate the cone bed. Astringent was applied to the cone bed. Following completion procedure all instrumentation was  removed from the vagina.  Patient was awakened, mobilized, and taken to the recovery room in satisfactory condition Estimated blood loss-10 cc Urine output 350 cc IV fluids-see anesthesia worksheet All instruments needles and sponge counts were verified as correct.  Dawn Rivera A. Dawn Plants, MD, ACOG [facility]

## 2015-04-12 NOTE — Telephone Encounter (Signed)
Lm for Dawn Rivera to call me- never heard from her.- Surgery was today.

## 2015-04-12 NOTE — Interval H&P Note (Signed)
History and Physical Interval Note:  04/12/2015 7:33 AM  Dawn Rivera  has presented today for surgery, with the diagnosis of CARCINOMA IN SITU OF CERVIX,MENORRHAGIA  The various methods of treatment have been discussed with the patient and family. After consideration of risks, benefits and other options for treatment, the patient has consented to  Procedure(s): LOOP ELECTROSURGICAL EXCISION PROCEDURE (LEEP) (N/A) as a surgical intervention .  The patient's history has been reviewed, patient examined, no change in status, stable for surgery.  I have reviewed the patient's chart and labs.  Questions were answered to the patient's satisfaction.     Hassell Done A [place] Hedman

## 2015-04-12 NOTE — Progress Notes (Signed)
Order for citrate discontinued as instructed by Dr. Enzo Bi verbal to Phillips Grout, RN

## 2015-04-12 NOTE — Transfer of Care (Signed)
Immediate Anesthesia Transfer of Care Note  Patient: Dawn Rivera  Procedure(s) Performed: Procedure(s): LOOP ELECTROSURGICAL EXCISION PROCEDURE (LEEP) (N/A)  Patient Location: PACU  Anesthesia Type:General  Level of Consciousness: awake, alert  and oriented  Airway & Oxygen Therapy: Patient Spontanous Breathing  Post-op Assessment: Report given to RN and Post -op Vital signs reviewed and stable  Post vital signs: Reviewed and stable  Last Vitals:  Filed Vitals:   04/12/15 0607 04/12/15 0611  BP: 126/80   Pulse: 78   Temp: 37.6 C 37.2 C  Resp: 16     Complications: No apparent anesthesia complications

## 2015-04-12 NOTE — Progress Notes (Signed)
Earrings x 5 removed preop and given to family (as instructed by Dr. Enzo Bi)

## 2015-04-15 ENCOUNTER — Encounter: Payer: Self-pay | Admitting: Obstetrics and Gynecology

## 2015-04-15 LAB — SURGICAL PATHOLOGY

## 2015-04-15 NOTE — Anesthesia Postprocedure Evaluation (Signed)
Anesthesia Post Note  Patient: Dawn Rivera  Procedure(s) Performed: Procedure(s) (LRB): LOOP ELECTROSURGICAL EXCISION PROCEDURE (LEEP) (N/A)  Patient location during evaluation: PACU Anesthesia Type: General Level of consciousness: awake and alert Pain management: pain level controlled Vital Signs Assessment: post-procedure vital signs reviewed and stable Respiratory status: spontaneous breathing, nonlabored ventilation, respiratory function stable and patient connected to nasal cannula oxygen Cardiovascular status: blood pressure returned to baseline and stable Postop Assessment: no signs of nausea or vomiting Anesthetic complications: no    Last Vitals:  Filed Vitals:   04/12/15 0918 04/12/15 0952  BP: 129/77 128/75  Pulse: 83 83  Temp: 36.2 C 36.2 C  Resp: 16 16    Last Pain:  Filed Vitals:   04/12/15 0953  PainSc: 0-No pain                 Molli Barrows

## 2015-04-21 ENCOUNTER — Encounter: Payer: Self-pay | Admitting: Obstetrics and Gynecology

## 2015-04-21 ENCOUNTER — Ambulatory Visit (INDEPENDENT_AMBULATORY_CARE_PROVIDER_SITE_OTHER): Payer: Managed Care, Other (non HMO) | Admitting: Obstetrics and Gynecology

## 2015-04-21 VITALS — BP 111/52 | HR 90 | Wt 140.1 lb

## 2015-04-21 DIAGNOSIS — Z09 Encounter for follow-up examination after completed treatment for conditions other than malignant neoplasm: Secondary | ICD-10-CM

## 2015-04-21 DIAGNOSIS — D069 Carcinoma in situ of cervix, unspecified: Secondary | ICD-10-CM

## 2015-04-21 NOTE — Patient Instructions (Signed)
1. Normal postop check. 2. No intercourse for 4 weeks 3. LAVH bilateral salpingectomy will be scheduled for 05/24/2015. Return for preop appointment week before surgery

## 2015-04-21 NOTE — Progress Notes (Signed)
Chief complaint: 1. One week postop check 2. Status post LEEP cone biopsy for CIS/CIN-3   PATHOLOGY:  Surgical Pathology  CASE: [id number]  PATIENT: Dawn Rivera  Surgical Pathology Report      SPECIMEN SUBMITTED:  A. Ectocervical LEEP, stitch @ 12:00  B. Endocervical LEEP  C. Post LEEP, endocervical curettings   CLINICAL HISTORY:  None provided   PRE-OPERATIVE DIAGNOSIS:  Carcinoma in SITU of cervix, menorrhagia   POST-OPERATIVE DIAGNOSIS:  Same as pre-op      DIAGNOSIS:  A. 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  - THE 3-6 AND 6-9 O'CLOCK ECTOCERVICAL MARGINS ARE INVOLVED.   B. ENDOCERVIX; LEEP:  - BENIGN ENDOCERVICAL TISSUE.   C. POST LEEP, ENDOCERVIX; CURETTAGE:  - BENIGN ENDOCERVICAL GLANDS AND BLOOD.            Patient has done well since surgery. Normal bowel and bladder function. Findings are reviewed.  OBJECTIVE: BP 111/52 mmHg  Pulse 90  Wt 140 lb 1 oz (63.532 kg)  LMP 04/15/2015 Pleasant white female in no acute distress Abdomen: Soft, nontender Pelvic exam: Speculum exam-healing cone bed with normal serous drainage, non-malodorous  ASSESSMENT: 1. Normal 1 week postop check 2. 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 Continue with routine postop care 2. No intercourse for 4 weeks 3. Schedule LAVH bilateral salpingectomy for 05/24/2015 4. Return for preop appointment the week before surgery.  Brayton Mars, MD  Note: This dictation was prepared with Dragon dictation along with smaller phrase technology. Any transcriptional errors that result from this process are unintentional.

## 2015-04-29 ENCOUNTER — Encounter: Payer: Self-pay | Admitting: Obstetrics and Gynecology

## 2015-04-29 NOTE — Telephone Encounter (Signed)
I was informed that you spoke with [facility] nurse about date of surgery and you decided on 05/24/2015. If this is not correct let me know.

## 2015-05-19 ENCOUNTER — Encounter
Admission: RE | Admit: 2015-05-19 | Discharge: 2015-05-19 | Disposition: A | Discharge status: Home / Self Care | Payer: Managed Care, Other (non HMO) | Source: Ambulatory Visit | Attending: Obstetrics and Gynecology | Admitting: Obstetrics and Gynecology

## 2015-05-19 ENCOUNTER — Ambulatory Visit (INDEPENDENT_AMBULATORY_CARE_PROVIDER_SITE_OTHER): Payer: Managed Care, Other (non HMO) | Admitting: Obstetrics and Gynecology

## 2015-05-19 ENCOUNTER — Encounter: Payer: Self-pay | Admitting: Obstetrics and Gynecology

## 2015-05-19 VITALS — BP 135/88 | HR 75 | Ht 63.0 in | Wt 141.7 lb

## 2015-05-19 DIAGNOSIS — Z01812 Encounter for preprocedural laboratory examination: Secondary | ICD-10-CM | POA: Diagnosis not present

## 2015-05-19 DIAGNOSIS — Z01818 Encounter for other preprocedural examination: Secondary | ICD-10-CM

## 2015-05-19 DIAGNOSIS — D069 Carcinoma in situ of cervix, unspecified: Secondary | ICD-10-CM

## 2015-05-19 HISTORY — DX: Malignant (primary) neoplasm, unspecified: C80.1

## 2015-05-19 LAB — TYPE AND SCREEN
ABO/RH(D): A POS
ANTIBODY SCREEN: NEGATIVE

## 2015-05-19 LAB — CBC WITH DIFFERENTIAL/PLATELET
BASOS PCT: 1 %
Basophils Absolute: 0.1 10*3/uL (ref 0–0.1)
Eosinophils Absolute: 0.2 10*3/uL (ref 0–0.7)
Eosinophils Relative: 3 %
HEMATOCRIT: 41.9 % (ref 35.0–47.0)
HEMOGLOBIN: 13.7 g/dL (ref 12.0–16.0)
LYMPHS ABS: 1.7 10*3/uL (ref 1.0–3.6)
LYMPHS PCT: 23 %
MCH: 28.5 pg (ref 26.0–34.0)
MCHC: 32.6 g/dL (ref 32.0–36.0)
MCV: 87.5 fL (ref 80.0–100.0)
MONO ABS: 0.4 10*3/uL (ref 0.2–0.9)
MONOS PCT: 5 %
NEUTROS ABS: 5.2 10*3/uL (ref 1.4–6.5)
NEUTROS PCT: 68 %
Platelets: 309 10*3/uL (ref 150–440)
RBC: 4.79 MIL/uL (ref 3.80–5.20)
RDW: 14.1 % (ref 11.5–14.5)
WBC: 7.6 10*3/uL (ref 3.6–11.0)

## 2015-05-19 LAB — RAPID HIV SCREEN (HIV 1/2 AB+AG)
HIV 1/2 ANTIBODIES: NONREACTIVE
HIV-1 P24 Antigen - HIV24: NONREACTIVE

## 2015-05-19 LAB — ABO/RH: ABO/RH(D): A POS

## 2015-05-19 NOTE — Patient Instructions (Signed)
  Your procedure is scheduled on: 05/24/15 Report to Day Surgery. [facility] second floor To find out your arrival time please call [phone] between 1PM - 3PM on3/24/17  Remember: Instructions that are not followed completely may result in serious medical risk, up to and including death, or upon the discretion of your surgeon and anesthesiologist your surgery may need to be rescheduled.    _x___ 1. Do not eat food or drink liquids after midnight. No gum chewing or hard candies.     __x__ 2. No Alcohol for 24 hours before or after surgery.   ____ 3. Bring all medications with you on the day of surgery if instructed.    __x__ 4. Notify your doctor if there is any change in your medical condition     (cold, fever, infections).     Do not wear jewelry, make-up, hairpins, clips or nail polish.  Do not wear lotions, powders, or perfumes. You may wear deodorant.  Do not shave 48 hours prior to surgery. Men may shave face and neck.  Do not bring valuables to the hospital.    [facility] is not responsible for any belongings or valuables.               Contacts, dentures or bridgework may not be worn into surgery.  Leave your suitcase in the car. After surgery it may be brought to your room.  For patients admitted to the hospital, discharge time is determined by your                treatment team.   Patients discharged the day of surgery will not be allowed to drive home.   Please read over the following fact sheets that you were given:   Surgical Site Infection Prevention   ____ Take these medicines the morning of surgery with A SIP OF WATER:    1. Xanax if needed  2.   3.   4.  5.  6.  ____ Fleet Enema (as directed)   __x__ Use CHG Soap as directed  ____ Use inhalers on the day of surgery  ____ Stop metformin 2 days prior to surgery    ____ Take 1/2 of usual insulin dose the night before surgery and none on the morning of surgery.   ____ Stop Coumadin/Plavix/aspirin  on  _x___ Stop Anti-inflammatories on    Stop advil until after surgery   ____ Stop supplements until after surgery.    ____ Bring C-Pap to the hospital.

## 2015-05-19 NOTE — Patient Instructions (Signed)
1. Return in 1 week for postop check

## 2015-05-19 NOTE — Progress Notes (Signed)
Subjective:  PREOPERATIVE HISTORY AND PHYSICAL   DOS:  05/24/2015 DX:  CIN3; AUB  Dawn Rivera is a 40 y.o. G4P3034fmale scheduled for LAVH Bilateral Salpingectomy. 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 Dawn Rivera has had no recent illness. No history of bleeding disorder. Not on blood thinner medication. CV risk factors include obesity.    Pertinent Gynecological History: Menses: flow is moderate Bleeding: dysfunctional uterine bleeding Contraception: abstinence Last mammogram: normal Date: 04/08/2015 LEEP with Cone Bx 04/12/15  CIN-3 with positive ectocervical margin  Discussed Blood/Blood Products: yes   Menstrual History: OB History    Gravida Para Term Preterm AB TAB SAB Ectopic Multiple Living   4 3 3  1  1   3       Menarche age: NA  Dawn Rivera's last menstrual period was 04/16/2015.    Past Medical History  Diagnosis Date  . Vaginal Pap smear, abnormal   . Depression   . Anxiety   . HPV (human papilloma virus) anogenital infection     Past Surgical History  Procedure Laterality Date  . Breast enhancement surgery  2003  . Augmentation mammaplasty Bilateral     breast implants  . Leep N/A 04/12/2015    Procedure: LOOP ELECTROSURGICAL EXCISION PROCEDURE (LEEP);  Surgeon: MBrayton Mars MD;  Location: [facility] ORS;  Service: Gynecology;  Laterality: N/A;    OB History  Gravida Para Term Preterm AB SAB TAB Ectopic Multiple Living  4 3 3  1 1    3     # Outcome Date GA Lbr Len/2nd Weight Sex Delivery Anes PTL Lv  4 Term 2003   7 lb 4.8 oz (3.311 kg) M Vag-Spont   Y  3 SAB 2002          2 Term 1999   7 lb 8 oz (3.402 kg) F Vag-Spont   Y  1 Term 1995   6 lb 4.8 oz (2.858 kg) M Vag-Spont   Y      Social History   Social History  . Marital Status: Married    Spouse Name: N/A  . Number of Children: N/A  . Years of Education: N/A   Social History Main Topics  . Smoking status: Never Smoker   . Smokeless tobacco: Never Used  . Alcohol Use: Yes   Comment: occas  . Drug Use: No  . Sexual Activity: Yes    Birth Control/ Protection: IUD   Other Topics Concern  . None   Social History Narrative    Family History  Problem Relation Age of Onset  . Breast cancer Maternal Aunt   . Heart disease Mother   . Colon cancer Maternal Grandmother   . Ovarian cancer Maternal Grandfather   . Diabetes Neg Hx      (Not in a hospital admission)  No Known Allergies  Review of Systems Constitutional: No recent fever/chills/sweats Respiratory: No recent cough/bronchitis Cardiovascular: No chest pain Gastrointestinal: No recent nausea/vomiting/diarrhea Genitourinary: No UTI symptoms Hematologic/lymphatic:No history of coagulopathy or recent blood thinner use    Objective:    BP 135/88 mmHg  Pulse 75  Ht 5' 3"  (1.6 m)  Wt 141 lb 11.2 oz (64.275 kg)  BMI 25.11 kg/m2  LMP 04/16/2015  General:   Normal  Skin:   normal  HEENT:  Normal  Neck:  Supple without Adenopathy or Thyromegaly  Lungs:   Heart:              Breasts:   Abdomen:  Pelvis:  M/S  Extremeties:  Neuro:    clear to auscultation bilaterally   Normal without murmur   Not Examined   soft, non-tender; bowel sounds normal; no masses,  no organomegaly   Exam deferred to OR  No CVAT  Warm/Dry   Normal          Assessment:   1. CIN III  2.  Abnormal uterine bleeding   Plan:   Dawn Rivera is to undergo laparoscopic-assisted vaginal hysterectomy with bilateral salpingectomy on 05/24/2015.  She is understanding of the planned procedure and is aware of and is accepting of all surgical risks which include but are not limited to bleeding, infection, pelvic organ injury, need for repair, blood clot disorders, anesthesia risks, etc.  All questions have been answered.  Informed consent is given.  Dawn Rivera is ready and willing to proceed with surgery as scheduled.  Brayton Mars, MD  Note: This dictation was prepared with Dragon dictation along with smaller phrase  technology. Any transcriptional errors that result from this process are unintentional.

## 2015-05-20 LAB — RPR: RPR: NONREACTIVE

## 2015-05-23 NOTE — H&P (Signed)
Subjective:  PREOPERATIVE HISTORY AND PHYSICAL   DOS:  05/24/2015 DX:  CIN3; AUB  Patient is a 40 y.o. G4P3048fmale scheduled for LAVH Bilateral Salpingectomy. 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 Patient has had no recent illness. No history of bleeding disorder. Not on blood thinner medication. CV risk factors include obesity.    Pertinent Gynecological History: Menses: flow is moderate Bleeding: dysfunctional uterine bleeding Contraception: abstinence Last mammogram: normal Date: 04/08/2015 LEEP with Cone Bx 04/12/15  CIN-3 with positive ectocervical margin  Discussed Blood/Blood Products: yes   Menstrual History: OB History    Gravida Para Term Preterm AB TAB SAB Ectopic Multiple Living   4 3 3  1  1   3       Menarche age: NA  Patient's last menstrual period was 04/16/2015.    Past Medical History  Diagnosis Date  . Vaginal Pap smear, abnormal   . Depression   . Anxiety   . HPV (human papilloma virus) anogenital infection     Past Surgical History  Procedure Laterality Date  . Breast enhancement surgery  2003  . Augmentation mammaplasty Bilateral     breast implants  . Leep N/A 04/12/2015    Procedure: LOOP ELECTROSURGICAL EXCISION PROCEDURE (LEEP);  Surgeon: MBrayton Mars MD;  Location: [facility] ORS;  Service: Gynecology;  Laterality: N/A;    OB History  Gravida Para Term Preterm AB SAB TAB Ectopic Multiple Living  4 3 3  1 1    3     # Outcome Date GA Lbr Len/2nd Weight Sex Delivery Anes PTL Lv  4 Term 2003   7 lb 4.8 oz (3.311 kg) M Vag-Spont   Y  3 SAB 2002          2 Term 1999   7 lb 8 oz (3.402 kg) F Vag-Spont   Y  1 Term 1995   6 lb 4.8 oz (2.858 kg) M Vag-Spont   Y      Social History   Social History  . Marital Status: Married    Spouse Name: N/A  . Number of Children: N/A  . Years of Education: N/A   Social History Main Topics  . Smoking status: Never Smoker   . Smokeless tobacco: Never Used  . Alcohol Use: Yes   Comment: occas  . Drug Use: No  . Sexual Activity: Yes    Birth Control/ Protection: IUD   Other Topics Concern  . None   Social History Narrative    Family History  Problem Relation Age of Onset  . Breast cancer Maternal Aunt   . Heart disease Mother   . Colon cancer Maternal Grandmother   . Ovarian cancer Maternal Grandfather   . Diabetes Neg Hx      (Not in a hospital admission)  No Known Allergies  Review of Systems Constitutional: No recent fever/chills/sweats Respiratory: No recent cough/bronchitis Cardiovascular: No chest pain Gastrointestinal: No recent nausea/vomiting/diarrhea Genitourinary: No UTI symptoms Hematologic/lymphatic:No history of coagulopathy or recent blood thinner use    Objective:    BP 135/88 mmHg  Pulse 75  Ht 5' 3"  (1.6 m)  Wt 141 lb 11.2 oz (64.275 kg)  BMI 25.11 kg/m2  LMP 04/16/2015  General:   Normal  Skin:   normal  HEENT:  Normal  Neck:  Supple without Adenopathy or Thyromegaly  Lungs:   Heart:              Breasts:   Abdomen:  Pelvis:  M/S  Extremeties:  Neuro:    clear to auscultation bilaterally   Normal without murmur   Not Examined   soft, non-tender; bowel sounds normal; no masses,  no organomegaly   Exam deferred to OR  No CVAT  Warm/Dry   Normal          Assessment:   1. CIN III  2.  Abnormal uterine bleeding   Plan:   Patient is to undergo laparoscopic-assisted vaginal hysterectomy with bilateral salpingectomy on 05/24/2015.  She is understanding of the planned procedure and is aware of and is accepting of all surgical risks which include but are not limited to bleeding, infection, pelvic organ injury, need for repair, blood clot disorders, anesthesia risks, etc.  All questions have been answered.  Informed consent is given.  Patient is ready and willing to proceed with surgery as scheduled.  Brayton Mars, MD  Note: This dictation was prepared with Dragon dictation along with smaller phrase  technology. Any transcriptional errors that result from this process are unintentional.

## 2015-05-24 ENCOUNTER — Observation Stay
Admission: RE | Admit: 2015-05-24 | Discharge: 2015-05-25 | Disposition: A | Discharge status: Home / Self Care | Payer: Managed Care, Other (non HMO) | Source: Ambulatory Visit | Attending: Obstetrics and Gynecology | Admitting: Obstetrics and Gynecology

## 2015-05-24 ENCOUNTER — Ambulatory Visit: Payer: Managed Care, Other (non HMO)

## 2015-05-24 ENCOUNTER — Encounter: Payer: Self-pay | Admitting: *Deleted

## 2015-05-24 ENCOUNTER — Ambulatory Visit: Payer: Managed Care, Other (non HMO) | Admitting: Anesthesiology

## 2015-05-24 ENCOUNTER — Encounter
Admission: RE | Disposition: A | Discharge status: Home / Self Care | Payer: Self-pay | Source: Ambulatory Visit | Attending: Obstetrics and Gynecology

## 2015-05-24 DIAGNOSIS — F419 Anxiety disorder, unspecified: Secondary | ICD-10-CM | POA: Diagnosis not present

## 2015-05-24 DIAGNOSIS — Z90710 Acquired absence of both cervix and uterus: Secondary | ICD-10-CM

## 2015-05-24 DIAGNOSIS — N938 Other specified abnormal uterine and vaginal bleeding: Secondary | ICD-10-CM | POA: Diagnosis not present

## 2015-05-24 DIAGNOSIS — F329 Major depressive disorder, single episode, unspecified: Secondary | ICD-10-CM | POA: Diagnosis not present

## 2015-05-24 DIAGNOSIS — Z833 Family history of diabetes mellitus: Secondary | ICD-10-CM | POA: Diagnosis not present

## 2015-05-24 DIAGNOSIS — D061 Carcinoma in situ of exocervix: Secondary | ICD-10-CM | POA: Diagnosis not present

## 2015-05-24 DIAGNOSIS — Z8249 Family history of ischemic heart disease and other diseases of the circulatory system: Secondary | ICD-10-CM | POA: Insufficient documentation

## 2015-05-24 DIAGNOSIS — D069 Carcinoma in situ of cervix, unspecified: Secondary | ICD-10-CM | POA: Diagnosis not present

## 2015-05-24 DIAGNOSIS — Z803 Family history of malignant neoplasm of breast: Secondary | ICD-10-CM | POA: Insufficient documentation

## 2015-05-24 DIAGNOSIS — Z9889 Other specified postprocedural states: Secondary | ICD-10-CM | POA: Diagnosis not present

## 2015-05-24 DIAGNOSIS — Z800 Family history of malignant neoplasm of digestive organs: Secondary | ICD-10-CM | POA: Insufficient documentation

## 2015-05-24 DIAGNOSIS — Z8041 Family history of malignant neoplasm of ovary: Secondary | ICD-10-CM | POA: Insufficient documentation

## 2015-05-24 DIAGNOSIS — N939 Abnormal uterine and vaginal bleeding, unspecified: Secondary | ICD-10-CM | POA: Insufficient documentation

## 2015-05-24 DIAGNOSIS — Z9882 Breast implant status: Secondary | ICD-10-CM | POA: Diagnosis not present

## 2015-05-24 HISTORY — PX: CYSTOSCOPY: SHX5120

## 2015-05-24 HISTORY — PX: LAPAROSCOPIC VAGINAL HYSTERECTOMY WITH SALPINGECTOMY: SHX6680

## 2015-05-24 LAB — POCT PREGNANCY, URINE: PREG TEST UR: NEGATIVE

## 2015-05-24 SURGERY — HYSTERECTOMY, VAGINAL, LAPAROSCOPY-ASSISTED, WITH SALPINGECTOMY
Anesthesia: General | Laterality: Bilateral

## 2015-05-24 MED ORDER — FENTANYL CITRATE (PF) 100 MCG/2ML IJ SOLN
INTRAMUSCULAR | Status: DC | PRN
  Administered 2015-05-24: 25 ug via INTRAVENOUS
  Administered 2015-05-24: 100 ug via INTRAVENOUS
  Administered 2015-05-24 (×3): 50 ug via INTRAVENOUS
  Administered 2015-05-24: 25 ug via INTRAVENOUS

## 2015-05-24 MED ORDER — ALPRAZOLAM 1 MG PO TABS
1.0000 mg | ORAL_TABLET | Freq: Three times a day (TID) | ORAL | Status: DC
  Administered 2015-05-24 (×2): 1 mg via ORAL
  Filled 2015-05-24 (×2): qty 1

## 2015-05-24 MED ORDER — EPHEDRINE SULFATE 50 MG/ML IJ SOLN
INTRAMUSCULAR | Status: DC | PRN
  Administered 2015-05-24 (×4): 5 mg via INTRAVENOUS

## 2015-05-24 MED ORDER — DEXAMETHASONE SODIUM PHOSPHATE 10 MG/ML IJ SOLN
INTRAMUSCULAR | Status: DC | PRN
  Administered 2015-05-24: 4 mg via INTRAVENOUS

## 2015-05-24 MED ORDER — ACETAMINOPHEN 325 MG PO TABS: 650.0000 mg | ORAL_TABLET | ORAL | Status: DC | PRN

## 2015-05-24 MED ORDER — ROCURONIUM BROMIDE 100 MG/10ML IV SOLN
INTRAVENOUS | Status: DC | PRN
  Administered 2015-05-24 (×2): 5 mg via INTRAVENOUS
  Administered 2015-05-24: 10 mg via INTRAVENOUS
  Administered 2015-05-24: 50 mg via INTRAVENOUS
  Administered 2015-05-24 (×2): 5 mg via INTRAVENOUS

## 2015-05-24 MED ORDER — OXYCODONE-ACETAMINOPHEN 5-325 MG PO TABS
1.0000 | ORAL_TABLET | ORAL | Status: DC | PRN
  Administered 2015-05-24 – 2015-05-25 (×4): 2 via ORAL
  Filled 2015-05-24 (×4): qty 2

## 2015-05-24 MED ORDER — ALFENTANIL 500 MCG/ML IJ INJ
INJECTION | INTRAMUSCULAR | Status: DC | PRN
  Administered 2015-05-24: 750 ug via INTRAVENOUS

## 2015-05-24 MED ORDER — LIDOCAINE HCL (CARDIAC) 20 MG/ML IV SOLN
INTRAVENOUS | Status: DC | PRN
  Administered 2015-05-24: 80 mg via INTRAVENOUS

## 2015-05-24 MED ORDER — FENTANYL CITRATE (PF) 100 MCG/2ML IJ SOLN
25.0000 ug | INTRAMUSCULAR | Status: DC | PRN
  Administered 2015-05-24 (×4): 25 ug via INTRAVENOUS

## 2015-05-24 MED ORDER — LACTATED RINGERS IV SOLN: INTRAVENOUS | Status: DC

## 2015-05-24 MED ORDER — FAMOTIDINE 20 MG PO TABS
ORAL_TABLET | ORAL | Status: AC
  Administered 2015-05-24: 20 mg via ORAL
  Filled 2015-05-24: qty 1

## 2015-05-24 MED ORDER — ONDANSETRON HCL 4 MG/2ML IJ SOLN
INTRAMUSCULAR | Status: DC | PRN
  Administered 2015-05-24: 4 mg via INTRAVENOUS

## 2015-05-24 MED ORDER — MIDAZOLAM HCL 2 MG/2ML IJ SOLN
INTRAMUSCULAR | Status: DC | PRN
  Administered 2015-05-24: 2 mg via INTRAVENOUS

## 2015-05-24 MED ORDER — LACTATED RINGERS IV SOLN
INTRAVENOUS | Status: DC
  Administered 2015-05-24 (×2): via INTRAVENOUS

## 2015-05-24 MED ORDER — CEFAZOLIN SODIUM-DEXTROSE 2-4 GM/100ML-% IV SOLN
2.0000 g | INTRAVENOUS | Status: DC
  Filled 2015-05-24: qty 100

## 2015-05-24 MED ORDER — CEFAZOLIN SODIUM-DEXTROSE 2-3 GM-% IV SOLR
INTRAVENOUS | Status: AC
  Administered 2015-05-24: 2000 mg
  Filled 2015-05-24: qty 50

## 2015-05-24 MED ORDER — FLUORESCEIN SODIUM 10 % IJ SOLN
INTRAMUSCULAR | Status: DC | PRN
  Administered 2015-05-24: .5 mL via INTRAVENOUS

## 2015-05-24 MED ORDER — MIDAZOLAM HCL 2 MG/2ML IJ SOLN
INTRAMUSCULAR | Status: DC | PRN
  Administered 2015-05-24: 1 mg via INTRAVENOUS

## 2015-05-24 MED ORDER — FLUORESCEIN SODIUM 10 % IJ SOLN
INTRAMUSCULAR | Status: AC
  Filled 2015-05-24: qty 5

## 2015-05-24 MED ORDER — DOCUSATE SODIUM 100 MG PO CAPS
100.0000 mg | ORAL_CAPSULE | Freq: Two times a day (BID) | ORAL | Status: DC
  Administered 2015-05-24 (×2): 100 mg via ORAL
  Filled 2015-05-24 (×2): qty 1

## 2015-05-24 MED ORDER — GLYCOPYRROLATE 0.2 MG/ML IJ SOLN
INTRAMUSCULAR | Status: DC | PRN
Start: 2015-05-24 — End: 2015-05-24
  Administered 2015-05-24: 0.2 mg via INTRAVENOUS
  Administered 2015-05-24: 0.6 mg via INTRAVENOUS

## 2015-05-24 MED ORDER — OXYCODONE HCL 5 MG/5ML PO SOLN: 5.0000 mg | Freq: Once | ORAL | Status: DC | PRN

## 2015-05-24 MED ORDER — PROPOFOL 10 MG/ML IV BOLUS
INTRAVENOUS | Status: DC | PRN
  Administered 2015-05-24: 130 mg via INTRAVENOUS

## 2015-05-24 MED ORDER — MORPHINE SULFATE (PF) 2 MG/ML IV SOLN
1.0000 mg | INTRAVENOUS | Status: DC | PRN
  Administered 2015-05-24 (×3): 2 mg via INTRAVENOUS
  Filled 2015-05-24 (×3): qty 1

## 2015-05-24 MED ORDER — BISACODYL 10 MG RE SUPP: 10.0000 mg | Freq: Every day | RECTAL | Status: DC | PRN

## 2015-05-24 MED ORDER — KETOROLAC TROMETHAMINE 30 MG/ML IJ SOLN
30.0000 mg | Freq: Four times a day (QID) | INTRAMUSCULAR | Status: DC
  Administered 2015-05-24 – 2015-05-25 (×3): 30 mg via INTRAVENOUS
  Filled 2015-05-24 (×3): qty 1

## 2015-05-24 MED ORDER — FAMOTIDINE 20 MG PO TABS
20.0000 mg | ORAL_TABLET | Freq: Once | ORAL | Status: AC
  Administered 2015-05-24: 20 mg via ORAL

## 2015-05-24 MED ORDER — SIMETHICONE 80 MG PO CHEW: 80.0000 mg | CHEWABLE_TABLET | Freq: Four times a day (QID) | ORAL | Status: DC | PRN

## 2015-05-24 MED ORDER — NEOSTIGMINE METHYLSULFATE 10 MG/10ML IV SOLN: INTRAVENOUS | Status: DC | PRN

## 2015-05-24 MED ORDER — NEOSTIGMINE METHYLSULFATE 10 MG/10ML IV SOLN
INTRAVENOUS | Status: DC | PRN
  Administered 2015-05-24: 4 mg via INTRAMUSCULAR

## 2015-05-24 MED ORDER — SODIUM CHLORIDE 0.9 % IV SOLN
INTRAVENOUS | Status: DC | PRN
  Administered 2015-05-24: 08:00:00 via INTRAVENOUS

## 2015-05-24 MED ORDER — BUPIVACAINE-EPINEPHRINE (PF) 0.5% -1:200000 IJ SOLN
INTRAMUSCULAR | Status: AC
  Filled 2015-05-24: qty 30

## 2015-05-24 MED ORDER — KETOROLAC TROMETHAMINE 30 MG/ML IJ SOLN
INTRAMUSCULAR | Status: DC | PRN
  Administered 2015-05-24: 30 mg via INTRAVENOUS

## 2015-05-24 MED ORDER — OXYCODONE HCL 5 MG PO TABS: 5.0000 mg | ORAL_TABLET | Freq: Once | ORAL | Status: DC | PRN

## 2015-05-24 MED ORDER — KETOROLAC TROMETHAMINE 30 MG/ML IJ SOLN: 30.0000 mg | Freq: Four times a day (QID) | INTRAMUSCULAR | Status: DC

## 2015-05-24 MED ORDER — FENTANYL CITRATE (PF) 100 MCG/2ML IJ SOLN
INTRAMUSCULAR | Status: AC
  Administered 2015-05-24: 25 ug via INTRAVENOUS
  Filled 2015-05-24: qty 2

## 2015-05-24 SURGICAL SUPPLY — 48 items
BAG URO DRAIN 2000ML W/SPOUT (MISCELLANEOUS) ×3 IMPLANT
BARRIER ADHS 3X4 INTERCEED (GAUZE/BANDAGES/DRESSINGS) IMPLANT
BLADE SURG 15 STRL LF DISP TIS (BLADE) ×2 IMPLANT
BLADE SURG 15 STRL SS (BLADE) ×1
BLADE SURG SZ10 CARB STEEL (BLADE) ×3 IMPLANT
BLADE SURG SZ11 CARB STEEL (BLADE) ×3 IMPLANT
BNDG ADH 2 X3.75 FABRIC TAN LF (GAUZE/BANDAGES/DRESSINGS) ×9 IMPLANT
CANISTER SUCT 1200ML W/VALVE (MISCELLANEOUS) ×3 IMPLANT
CATH FOLEY 2WAY  5CC 16FR (CATHETERS) ×1
CATH URTH 16FR FL 2W BLN LF (CATHETERS) ×2 IMPLANT
CHLORAPREP W/TINT 26ML (MISCELLANEOUS) ×3 IMPLANT
CORD MONOPOLAR M/FML 12FT (MISCELLANEOUS) IMPLANT
ELECT REM PT RETURN 9FT ADLT (ELECTROSURGICAL) ×3
ELECTRODE REM PT RTRN 9FT ADLT (ELECTROSURGICAL) ×2 IMPLANT
FILTER LAP SMOKE EVAC STRL (MISCELLANEOUS) ×3 IMPLANT
GAUZE PACK 2X3YD (MISCELLANEOUS) ×3 IMPLANT
GAUZE PETROLATUM 1 X8 (GAUZE/BANDAGES/DRESSINGS) ×3 IMPLANT
GLOVE BIO SURGEON STRL SZ8 (GLOVE) ×3 IMPLANT
GOWN STRL REUS W/ TWL LRG LVL3 (GOWN DISPOSABLE) ×2 IMPLANT
GOWN STRL REUS W/ TWL XL LVL3 (GOWN DISPOSABLE) ×4 IMPLANT
GOWN STRL REUS W/TWL LRG LVL3 (GOWN DISPOSABLE) ×1
GOWN STRL REUS W/TWL XL LVL3 (GOWN DISPOSABLE) ×2
HANDLE YANKAUER SUCT BULB TIP (MISCELLANEOUS) ×3 IMPLANT
IRRIGATION STRYKERFLOW (MISCELLANEOUS) ×2 IMPLANT
IRRIGATOR STRYKERFLOW (MISCELLANEOUS) ×3
IV LACTATED RINGERS 1000ML (IV SOLUTION) ×3 IMPLANT
KIT PINK PAD W/HEAD ARE REST (MISCELLANEOUS) ×3
KIT PINK PAD W/HEAD ARM REST (MISCELLANEOUS) ×2 IMPLANT
KIT RM TURNOVER CYSTO AR (KITS) ×3 IMPLANT
LABEL OR SOLS (LABEL) ×3 IMPLANT
PACK GYN LAPAROSCOPIC (MISCELLANEOUS) ×3 IMPLANT
PAD OB MATERNITY 4.3X12.25 (PERSONAL CARE ITEMS) ×3 IMPLANT
PAD PREP 24X41 OB/GYN DISP (PERSONAL CARE ITEMS) ×3 IMPLANT
SCISSORS METZENBAUM CVD 33 (INSTRUMENTS) ×3 IMPLANT
SET CYSTO W/LG BORE CLAMP LF (SET/KITS/TRAYS/PACK) ×3 IMPLANT
SHEARS HARMONIC ACE PLUS 36CM (ENDOMECHANICALS) ×3 IMPLANT
SPONGE XRAY 4X4 16PLY STRL (MISCELLANEOUS) ×3 IMPLANT
STRIP CLOSURE SKIN 1/2X4 (GAUZE/BANDAGES/DRESSINGS) ×3 IMPLANT
SUT CHROMIC 2 0 SH (SUTURE) ×12 IMPLANT
SUT VIC AB 0 CT1 36 (SUTURE) ×6 IMPLANT
SUT VIC AB 0 CT2 27 (SUTURE) ×3 IMPLANT
SUT VIC AB 2-0 CT1 (SUTURE) ×6 IMPLANT
SUT VIC AB 4-0 FS2 27 (SUTURE) ×6 IMPLANT
SYRINGE 10CC LL (SYRINGE) ×3 IMPLANT
TROCAR ENDO BLADELESS 11MM (ENDOMECHANICALS) ×3 IMPLANT
TROCAR XCEL NON-BLD 11X100MML (ENDOMECHANICALS) IMPLANT
TROCAR XCEL NON-BLD 5MMX100MML (ENDOMECHANICALS) ×9 IMPLANT
TUBING INSUFFLATOR HEATED (MISCELLANEOUS) ×3 IMPLANT

## 2015-05-24 NOTE — Anesthesia Procedure Notes (Signed)
Procedure Name: Intubation Performed by: Lance Muss Pre-anesthesia Checklist: Emergency Drugs available, Patient identified, Patient being monitored, Timeout performed and Suction available Patient Re-evaluated:Patient Re-evaluated prior to inductionOxygen Delivery Method: Circle system utilized Preoxygenation: Pre-oxygenation with 100% oxygen Intubation Type: IV induction Ventilation: Mask ventilation without difficulty Laryngoscope Size: Mac and 3 Grade View: Grade II Tube type: Oral Tube size: 7.0 mm Number of attempts: 1 Airway Equipment and Method: Bougie stylet and Stylet Placement Confirmation: positive ETCO2 and breath sounds checked- equal and bilateral Secured at: 22 cm Tube secured with: Tape Dental Injury: Teeth and Oropharynx as per pre-operative assessment  Difficulty Due To: Difficult Airway- due to anterior larynx

## 2015-05-24 NOTE — Interval H&P Note (Signed)
History and Physical Interval Note:  05/24/2015 9:11 AM  Marene Bonney Roussel  has presented today for surgery, with the diagnosis of CIS(CARCINOMA IN SITU OF CERVIX)  The various methods of treatment have been discussed with the patient and family. After consideration of risks, benefits and other options for treatment, the patient has consented to  Procedure(s): LAPAROSCOPIC ASSISTED VAGINAL HYSTERECTOMY WITH BILATERAL SALPINGECTOMY (Bilateral) as a surgical intervention .  The patient's history has been reviewed, patient examined, no change in status, stable for surgery.  I have reviewed the patient's chart and labs.  Questions were answered to the patient's satisfaction.     Hassell Done A Andrya Roppolo

## 2015-05-24 NOTE — Op Note (Signed)
OPERATIVE NOTE:  Dawn Rivera PROCEDURE DATE: 05/24/2015   PREOPERATIVE DIAGNOSIS:  1. CIN-3 with positive ectocervical margin on LEEP 2. Abnormal uterine bleeding POSTOPERATIVE DIAGNOSIS: Same as above and patent ureters on cystoscopy, and suspected endometriosis PROCEDURE: LAVH bilateral salpingectomy and cystoscopy  SURGEON:  Brayton Mars, MD ASSISTANTS: Dr. Marcelline Mates ANESTHESIA: General INDICATIONS: [name] with history of CIN-3 with positive ectocervical margins from the LEEP cone biopsy, and history of abnormal uterine bleeding, presents for definitive surgery.  FINDINGS:   1. Grossly normal appearing uterus and bilateral tubes and ovaries 2. Suspected endometriosis with yellow lesions and red lesions in the cul-de-sac 3. Cystoscopy demonstrated a normal reflux from ureters bilaterally   I/O's: Total I/O In: 1200 [I.V.:1200] Out: 1100 [Urine:800; Blood:300] COUNTS:  YES SPECIMENS: Uterus, cervix, bilateral fallopian tubes ANTIBIOTIC PROPHYLAXIS:Ancef 2 grams COMPLICATIONS: None immediate  PROCEDURE IN DETAIL:  Patient was brought to the operating room versus placed in the supine position. General endotracheal anesthesia was induced without difficulty. She was placed in the dorsal lithotomy position using the bumblebee stirrups. A ChloraPrep and Betadine abdominal perineal intravaginal prep and drape was performed in standard fashion. Timeout was performed. Foley catheter was placed and was draining clear yellow urine. A Hulka tenaculum was placed onto the cervix following dilation of the endocervical canal in order to facilitate manipulation of the uterus during surgery. Laparoscopy was initiated with a 5 mm incision in the subumbilical region. The Optiview laparoscopic trocar system was then placed directly into the abdominal pelvic cavity under direct visualization without evidence of bowel or vascular injury. 2 other 5 mm ports were placed in the right lower  quadrant and left lower quadrant respectively. The above-noted findings were documented. Grasper and Ace Harmonic scalpel was used to facilitate the laparoscopic portion of the procedure. The Ace Harmonic scalpel was used to clamp coagulated and cut the mesosalpinx to separate it from the ovary and pelvic sidewall. This was done bilaterally. The round ligaments were clamped coagulated and cut with the Harmonic scalpel. The utero-ovarian ligaments were clamped coagulated and cut with the Harmonic scalpel. The cardinal broad ligament complexes were taken down bilaterally using a similar technique to the level of the uterosacral ligaments.. Bladder flap was created with the Harmonic scalpel and using both sharp and blunt dissection. Once all pedicles were adequately mobilized the uterine arteries were cauterized with Kleppinger bipolar forceps bilaterally. The vaginal portion of the hysterectomy was then performed in standard fashion. A double-tooth tenaculum was used to grasp the cervix. The cervix was scarred during the dissection process. A posterior colpotomy was made with Mayo scissors. Uterosacral ligaments were clamped cut and stick tied using 0 Vicryl suture. The cervix was circumscribed with the Bovie in order to facilitate dissection of the vagina and bladder off of the lower uterine segment. This was done with both sharp and blunt dissection. Anterior cul-de-sac was entered. The remainder of the cardinal broad ligament complexes were taken down using a clamping cutting and stick tying technique of 0 Vicryl suture. Specimen was removed from the pelvic cavity. The posterior cuff was run with a 0 Vicryl suture in a baseball stitch running manner. The pedicle on the right sidewall had persistent oozing and had to be re-ligated using several 0 Vicryl sutures. Good hemostasis was obtained. The vagina was closed with 2-0 chromic sutures in a simple interrupted manner. Repeat laparoscopy was then performed and  demonstrated excellent hemostasis. Following completion of the laparoscopy, cystoscopy was performed with a 30 scope  to verify ureteral patency. 0.5 cc of fluorescein dye was injected intravenously and patency was confirmed. Bladder was drained. Procedure was terminated following closure of the abdominal incisions with 3-0 Monocryl. Telfa and Tegaderm dressings were applied. Patient was then awakened extubated and taken to the recovery room in satisfactory condition  Dawn Rivera A. Zipporah Plants, MD, ACOG [facility]

## 2015-05-24 NOTE — Transfer of Care (Signed)
Immediate Anesthesia Transfer of Care Note  Patient: Dawn Rivera  Procedure(s) Performed: Procedure(s): LAPAROSCOPIC ASSISTED VAGINAL HYSTERECTOMY WITH BILATERAL SALPINGECTOMY (Bilateral) CYSTOSCOPY  Patient Location: PACU  Anesthesia Type:General  Level of Consciousness: awake, alert , patient cooperative and responds to stimulation  Airway & Oxygen Therapy: Patient Spontanous Breathing and Patient connected to face mask oxygen  Post-op Assessment: Report given to RN and Post -op Vital signs reviewed and stable  Post vital signs: Reviewed and stable  Last Vitals:  Filed Vitals:   05/24/15 0728 05/24/15 1234  BP: 121/87 110/48  Pulse: 70 99  Temp: 36.7 C   Resp: 16 13    Complications: No apparent anesthesia complications

## 2015-05-24 NOTE — Anesthesia Postprocedure Evaluation (Signed)
Anesthesia Post Note  Patient: Dawn Rivera  Procedure(s) Performed: Procedure(s) (LRB): LAPAROSCOPIC ASSISTED VAGINAL HYSTERECTOMY WITH BILATERAL SALPINGECTOMY (Bilateral) CYSTOSCOPY  Patient location during evaluation: PACU Anesthesia Type: General Level of consciousness: awake and alert Pain management: pain level controlled Vital Signs Assessment: post-procedure vital signs reviewed and stable Respiratory status: spontaneous breathing, nonlabored ventilation, respiratory function stable and patient connected to nasal cannula oxygen Cardiovascular status: blood pressure returned to baseline and stable Postop Assessment: no signs of nausea or vomiting Anesthetic complications: no    Last Vitals:  Filed Vitals:   05/24/15 1319 05/24/15 1345  BP: 114/76 109/67  Pulse: 68 70  Temp: 36.7 C 36.9 C  Resp: 16 16    Last Pain:  Filed Vitals:   05/24/15 1413  PainSc: 7                  Joseph K Piscitello

## 2015-05-24 NOTE — Anesthesia Preprocedure Evaluation (Signed)
Anesthesia Evaluation  Patient identified by MRN, date of birth, ID band Patient awake    Reviewed: Allergy & Precautions, H&P , NPO status , Patient's Chart, lab work & pertinent test results  History of Anesthesia Complications Negative for: history of anesthetic complications  Airway Mallampati: II  TM Distance: >3 FB Neck ROM: full    Dental  (+) Poor Dentition   Pulmonary neg pulmonary ROS, neg shortness of breath,    Pulmonary exam normal breath sounds clear to auscultation       Cardiovascular Exercise Tolerance: Good (-) angina(-) Past MI and (-) DOE negative cardio ROS Normal cardiovascular exam Rhythm:regular Rate:Normal     Neuro/Psych PSYCHIATRIC DISORDERS negative neurological ROS     GI/Hepatic negative GI ROS, Neg liver ROS, neg GERD  ,  Endo/Other  negative endocrine ROS  Renal/GU negative Renal ROS  negative genitourinary   Musculoskeletal   Abdominal   Peds  Hematology negative hematology ROS (+)   Anesthesia Other Findings Past Medical History:   Vaginal Pap smear, abnormal                                  Depression                                                   Anxiety                                                      HPV (human papilloma virus) anogenital infecti*              Cancer (HCC)                                                   Comment:cervical  Past Surgical History:   BREAST ENHANCEMENT SURGERY                       2003         AUGMENTATION MAMMAPLASTY                        Bilateral                Comment:breast implants   LEEP                                            N/A 04/12/2015      Comment:Procedure: LOOP ELECTROSURGICAL EXCISION               PROCEDURE (LEEP);  Surgeon: Brayton Mars, MD;  Location: [facility] ORS;  Service:              Gynecology;  Laterality: N/A;     Reproductive/Obstetrics negative OB ROS  Anesthesia Physical Anesthesia Plan  ASA: III  Anesthesia Plan: General ETT   Post-op Pain Management:    Induction:   Airway Management Planned:   Additional Equipment:   Intra-op Plan:   Post-operative Plan:   Informed Consent: I have reviewed the patients History and Physical, chart, labs and discussed the procedure including the risks, benefits and alternatives for the proposed anesthesia with the patient or authorized representative who has indicated his/her understanding and acceptance.   Dental Advisory Given  Plan Discussed with: Anesthesiologist, CRNA and Surgeon  Anesthesia Plan Comments:         Anesthesia Quick Evaluation

## 2015-05-24 NOTE — H&P (View-Only) (Signed)
Subjective:  PREOPERATIVE HISTORY AND PHYSICAL   DOS:  05/24/2015 DX:  CIN3; AUB  Patient is a 40 y.o. [id number] scheduled for LAVH Bilateral Salpingectomy. 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 Patient has had no recent illness. No history of bleeding disorder. Not on blood thinner medication. CV risk factors include obesity.    Pertinent Gynecological History: Menses: flow is moderate Bleeding: dysfunctional uterine bleeding Contraception: abstinence Last mammogram: normal Date: 04/08/2015 LEEP with [facility] Bx 04/12/15  CIN-3 with positive ectocervical margin  Discussed Blood/Blood Products: yes   Menstrual History: OB History    Gravida Para Term Preterm AB TAB SAB Ectopic Multiple Living   4 3 3  1  1   3       Menarche age: NA  Patient's last menstrual period was 04/16/2015.    Past Medical History  Diagnosis Date  . Vaginal Pap smear, abnormal   . Depression   . Anxiety   . HPV (human papilloma virus) anogenital infection     Past Surgical History  Procedure Laterality Date  . Breast enhancement surgery  2003  . Augmentation mammaplasty Bilateral     breast implants  . Leep N/A 04/12/2015    Procedure: LOOP ELECTROSURGICAL EXCISION PROCEDURE (LEEP);  Surgeon: MBrayton Mars MD;  Location: [facility] ORS;  Service: Gynecology;  Laterality: N/A;    OB History  Gravida Para Term Preterm AB SAB TAB Ectopic Multiple Living  4 3 3  1 1    3     # Outcome Date GA Lbr Len/2nd Weight Sex Delivery Anes PTL Lv  4 Term 2003   7 lb 4.8 oz (3.311 kg) M Vag-Spont   Y  3 SAB 2002          2 Term 1999   7 lb 8 oz (3.402 kg) F Vag-Spont   Y  1 Term 1995   6 lb 4.8 oz (2.858 kg) M Vag-Spont   Y      Social History   Social History  . Marital Status: Married    Spouse Name: N/A  . Number of Children: N/A  . Years of Education: N/A   Social History Main Topics  . Smoking status: Never Smoker   . Smokeless tobacco: Never Used  . Alcohol Use: Yes   Comment: occas  . Drug Use: No  . Sexual Activity: Yes    Birth Control/ Protection: IUD   Other Topics Concern  . None   Social History Narrative    Family History  Problem Relation Age of Onset  . Breast cancer Maternal Aunt   . Heart disease Mother   . Colon cancer Maternal Grandmother   . Ovarian cancer Maternal Grandfather   . Diabetes Neg Hx      (Not in a hospital admission)  No Known Allergies  Review of Systems Constitutional: No recent fever/chills/sweats Respiratory: No recent cough/bronchitis Cardiovascular: No chest pain Gastrointestinal: No recent nausea/vomiting/diarrhea Genitourinary: No UTI symptoms Hematologic/lymphatic:No history of coagulopathy or recent blood thinner use    Objective:    BP 135/88 mmHg  Pulse 75  Ht 5' 3"  (1.6 m)  Wt 141 lb 11.2 oz (64.275 kg)  BMI 25.11 kg/m2  LMP 04/16/2015  General:   Normal  Skin:   normal  HEENT:  Normal  Neck:  Supple without Adenopathy or Thyromegaly  Lungs:   Heart:              Breasts:   Abdomen:  Pelvis:  M/S  Extremeties:  Neuro:    clear to auscultation bilaterally   Normal without murmur   Not Examined   soft, non-tender; bowel sounds normal; no masses,  no organomegaly   Exam deferred to OR  No CVAT  Warm/Dry   Normal          Assessment:   1. CIN III  2.  Abnormal uterine bleeding   Plan:   Patient is to undergo laparoscopic-assisted vaginal hysterectomy with bilateral salpingectomy on 05/24/2015.  She is understanding of the planned procedure and is aware of and is accepting of all surgical risks which include but are not limited to bleeding, infection, pelvic organ injury, need for repair, blood clot disorders, anesthesia risks, etc.  All questions have been answered.  Informed consent is given.  Patient is ready and willing to proceed with surgery as scheduled.  Brayton Mars, MD  Note: This dictation was prepared with Dragon dictation along with smaller phrase  technology. Any transcriptional errors that result from this process are unintentional.

## 2015-05-24 NOTE — Anesthesia Preprocedure Evaluation (Deleted)
Anesthesia Evaluation  Patient identified by MRN, date of birth, ID band Patient awake    Reviewed: Allergy & Precautions, H&P , NPO status , Patient's Chart, lab work & pertinent test results  History of Anesthesia Complications Negative for: history of anesthetic complications  Airway Mallampati: II  TM Distance: >3 FB Neck ROM: full    Dental  (+) Poor Dentition   Pulmonary neg pulmonary ROS, neg shortness of breath,    Pulmonary exam normal breath sounds clear to auscultation       Cardiovascular Exercise Tolerance: Good (-) angina(-) Past MI and (-) DOE negative cardio ROS Normal cardiovascular exam Rhythm:regular Rate:Normal     Neuro/Psych PSYCHIATRIC DISORDERS Anxiety Depression negative neurological ROS     GI/Hepatic negative GI ROS, Neg liver ROS, neg GERD  ,  Endo/Other  negative endocrine ROS  Renal/GU negative Renal ROS  negative genitourinary   Musculoskeletal   Abdominal   Peds  Hematology negative hematology ROS (+)   Anesthesia Other Findings Past Medical History:   Vaginal Pap smear, abnormal                                  Depression                                                   Anxiety                                                      HPV (human papilloma virus) anogenital infecti*              Cancer (HCC)                                                   Comment:cervical  Past Surgical History:   BREAST ENHANCEMENT SURGERY                       2003         AUGMENTATION MAMMAPLASTY                        Bilateral                Comment:breast implants   LEEP                                            N/A 04/12/2015      Comment:Procedure: LOOP ELECTROSURGICAL EXCISION               PROCEDURE (LEEP);  Surgeon: Brayton Mars, MD;  Location: [facility];  Service:              Gynecology;  Laterality: N/A;     Reproductive/Obstetrics negative OB ROS  Anesthesia Physical Anesthesia Plan  ASA: III  Anesthesia Plan: General ETT   Post-op Pain Management:    Induction:   Airway Management Planned:   Additional Equipment:   Intra-op Plan:   Post-operative Plan:   Informed Consent: I have reviewed the patients History and Physical, chart, labs and discussed the procedure including the risks, benefits and alternatives for the proposed anesthesia with the patient or authorized representative who has indicated his/her understanding and acceptance.   Dental Advisory Given  Plan Discussed with: Anesthesiologist, CRNA and Surgeon  Anesthesia Plan Comments:         Anesthesia Quick Evaluation

## 2015-05-24 NOTE — Progress Notes (Signed)
I have verified all carting done by Gaspar Garbe (Student-RN). Alecia Lemming RN

## 2015-05-25 ENCOUNTER — Encounter: Payer: Self-pay | Admitting: Obstetrics and Gynecology

## 2015-05-25 DIAGNOSIS — Z90710 Acquired absence of both cervix and uterus: Secondary | ICD-10-CM

## 2015-05-25 DIAGNOSIS — D061 Carcinoma in situ of exocervix: Secondary | ICD-10-CM | POA: Diagnosis not present

## 2015-05-25 LAB — SURGICAL PATHOLOGY

## 2015-05-25 LAB — HEMOGLOBIN: HEMOGLOBIN: 11.5 g/dL — AB (ref 12.0–16.0)

## 2015-05-25 MED ORDER — DOCUSATE SODIUM 100 MG PO CAPS: 100.0000 mg | ORAL_CAPSULE | Freq: Two times a day (BID) | ORAL | Status: DC

## 2015-05-25 MED ORDER — IBUPROFEN 800 MG PO TABS: 800.0000 mg | ORAL_TABLET | Freq: Three times a day (TID) | ORAL | Status: DC

## 2015-05-25 MED ORDER — OXYCODONE-ACETAMINOPHEN 5-325 MG PO TABS: 1.0000 | ORAL_TABLET | ORAL | Status: DC | PRN

## 2015-05-25 NOTE — Discharge Summary (Signed)
Physician Discharge Summary  Patient ID: Dawn Rivera MRN: [id number] DOB/AGE: October 02, 1975 40 y.o.  Admit date: 05/24/2015 Discharge date: 05/25/2015  Admission Diagnoses: CIN 3  Discharge Diagnoses:  SAA  Operative Procedures: Procedure(s): LAPAROSCOPIC ASSISTED VAGINAL HYSTERECTOMY WITH BILATERAL SALPINGECTOMY (Bilateral) CYSTOSCOPY  Hospital Course: Uncomplicated   Significant Diagnostic Studies:  Lab Results  Component Value Date   HGB 11.5* 05/25/2015   HGB 13.7 05/19/2015   HGB 13.9 04/08/2015   Lab Results  Component Value Date   HCT 41.9 05/19/2015   HCT 42.8 04/08/2015   HCT 39.2 10/26/2011   CBC Latest Ref Rng 05/25/2015 05/19/2015 04/08/2015  WBC 3.6 - 11.0 K/uL - 7.6 9.9  Hemoglobin 12.0 - 16.0 g/dL 11.5(L) 13.7 13.9  Hematocrit 35.0 - 47.0 % - 41.9 42.8  Platelets 150 - 440 K/uL - 309 348     Discharged Condition: good  Discharge Exam: Blood pressure 126/82, pulse 68, temperature 98 F (36.7 C), temperature source Oral, resp. rate 18, height 5' 3"  (1.6 m), weight 141 lb 11.2 oz (64.275 kg), SpO2 97 %. Incision/Wound: clean, dry and no drainage  Disposition: 01-Home or Self Care      Discharge Instructions    Discharge patient    Complete by:  As directed             Medication List    TAKE these medications        docusate sodium 100 MG capsule  Commonly known as:  COLACE  Take 1 capsule (100 mg total) by mouth 2 (two) times daily.     fluticasone 50 MCG/ACT nasal spray  Commonly known as:  FLONASE  Place into both nostrils daily.     ibuprofen 800 MG tablet  Commonly known as:  ADVIL,MOTRIN  Take 1 tablet (800 mg total) by mouth 3 (three) times daily.     ibuprofen 800 MG tablet  Commonly known as:  ADVIL,MOTRIN  Take 1 tablet (800 mg total) by mouth 3 (three) times daily.     loratadine 10 MG tablet  Commonly known as:  CLARITIN  Take 10 mg by mouth daily.     oxyCODONE-acetaminophen 5-325 MG tablet  Commonly known as:   PERCOCET/ROXICET  Take 1-2 tablets by mouth every 4 (four) hours as needed (moderate to severe pain (when tolerating fluids)).     sertraline 100 MG tablet  Commonly known as:  ZOLOFT  Take 1 tablet (100 mg total) by mouth 2 (two) times daily.     traZODone 100 MG tablet  Commonly known as:  DESYREL  Take 1 tablet (100 mg total) by mouth at bedtime.     valACYclovir 1000 MG tablet  Commonly known as:  VALTREX  Take 1 tablet (1,000 mg total) by mouth daily.     vitamin C 100 MG tablet  Take 100 mg by mouth daily.     XANAX 1 MG tablet  Generic drug:  ALPRAZolam  3 (three) times daily as needed for anxiety.       Follow-up Information    Follow up with Brayton Mars, MD. Schedule an appointment as soon as possible for a visit in 1 week.   Specialties:  Obstetrics and Gynecology, Radiology   Why:  Post Op Check   Contact information:   [address] [place] [address] [phone]       Signed: Alanda Slim Jessia Kief 05/25/2015, 7:14 AM

## 2015-05-25 NOTE — Discharge Instructions (Signed)
Laparoscopically Assisted Vaginal Hysterectomy, Care After Refer to this sheet in the next few weeks. These instructions provide you with information on caring for yourself after your procedure. Your health care provider may also give you more specific instructions. Your treatment has been planned according to current medical practices, but problems sometimes occur. Call your health care provider if you have any problems or questions after your procedure. WHAT TO EXPECT AFTER THE PROCEDURE After your procedure, it is typical to have the following:  Abdominal pain. You will be given pain medicine to control it.  Sore throat from the breathing tube that was inserted during surgery. HOME CARE INSTRUCTIONS  Only take over-the-counter or prescription medicines for pain, discomfort, or fever as directed by your health care provider.  Do not take aspirin. It can cause bleeding.  Do not drive when taking pain medicine.  Follow your health care provider's advice regarding diet, exercise, lifting, driving, and general activities.  Resume your usual diet as directed and allowed.  Get plenty of rest and sleep.  Do not douche, use tampons, or have sexual intercourse for at least 6 weeks, or until your health care provider gives you permission.  Change your bandages (dressings) as directed by your health care provider.  Monitor your temperature and notify your health care provider of a fever.  Take showers instead of baths for 2-3 weeks.  Do not drink alcohol until your health care provider gives you permission.  If you develop constipation, you may take a mild laxative with your health care provider's permission. Bran foods may help with constipation problems. Drinking enough fluids to keep your urine clear or pale yellow may help as well.  Try to have someone home with you for 1-2 weeks to help around the house.  Keep all of your follow-up appointments as directed by your health care  provider. SEEK MEDICAL CARE IF:   You have swelling, redness, or increasing pain around your incision sites.  You have pus coming from your incision.  You notice a bad smell coming from your incision.  Your incision breaks open.  You feel dizzy or lightheaded.  You have pain or bleeding when you urinate.  You have persistent diarrhea.  You have persistent nausea and vomiting.  You have abnormal vaginal discharge.  You have a rash.  You have any type of abnormal reaction or develop an allergy to your medicine.  You have poor pain control with your prescribed medicine. SEEK IMMEDIATE MEDICAL CARE IF:   You have a fever.  You have severe abdominal pain.  You have chest pain.  You have shortness of breath.  You faint.  You have pain, swelling, or redness in your leg.  You have heavy vaginal bleeding with blood clots. MAKE SURE YOU:  Understand these instructions.  Will watch your condition.  Will get help right away if you are not doing well or get worse.   This information is not intended to replace advice given to you by your health care provider. Make sure you discuss any questions you have with your health care provider.   Document Released: 02/02/2011 Document Revised: 02/18/2013 Document Reviewed: 08/29/2012 Elsevier Interactive Patient Education [employer].

## 2015-05-25 NOTE — Progress Notes (Signed)
D/C order from Dr. Enzo Bi.  Reviewed d/c instructions and prescriptions with patient and answered any questions.  Patient d/c home with family.

## 2015-06-01 ENCOUNTER — Encounter: Payer: Self-pay | Admitting: Obstetrics and Gynecology

## 2015-06-01 ENCOUNTER — Ambulatory Visit (INDEPENDENT_AMBULATORY_CARE_PROVIDER_SITE_OTHER): Payer: Managed Care, Other (non HMO) | Admitting: Obstetrics and Gynecology

## 2015-06-01 VITALS — BP 115/78 | HR 88 | Ht 63.0 in | Wt 143.9 lb

## 2015-06-01 DIAGNOSIS — Z90710 Acquired absence of both cervix and uterus: Secondary | ICD-10-CM

## 2015-06-01 DIAGNOSIS — Z09 Encounter for follow-up examination after completed treatment for conditions other than malignant neoplasm: Secondary | ICD-10-CM

## 2015-06-01 DIAGNOSIS — D069 Carcinoma in situ of cervix, unspecified: Secondary | ICD-10-CM

## 2015-06-01 NOTE — Patient Instructions (Signed)
1. Return in 5 weeks for final postoperative check 2. Gradually resume activities as tolerated. No sex for 5 more weeks.

## 2015-06-01 NOTE — Progress Notes (Signed)
Chief complaint: 1. One week postop check 2. Status post LAVH bilateral salpingectomy  Patient is doing well 1 week post surgery. She has returned to work. Bowel and bladder function are normal. She is using occasional ibuprofen for discomfort.  Pathology: DIAGNOSIS:  A. UTERUS WITH CERVIX AND BILATERAL FALLOPIAN TUBES; HYSTERECTOMY WITH  BILATERAL SALPINGECTOMY:  - CERVIX WITH RESIDUAL HIGH-GRADE SQUAMOUS INTRAEPITHELIAL LESION AND  POSTSURGICAL CHANGES.  - WEAKLY PROLIFERATIVE ENDOMETRIUM.  - BILATERAL FALLOPIAN TUBES WITHOUT PATHOLOGIC CHANGE.   OBJECTIVE: BP 115/78 mmHg  Pulse 88  Ht 5' 3"  (1.6 m)  Wt 143 lb 14.4 oz (65.273 kg)  BMI 25.50 kg/m2  LMP 04/16/2015 Physical exam deferred  ASSESSMENT: 1. One week postop check-normal 2. Status post LAVH bilateral salpingectomy for high-grade dysplasia  PLAN: 1. Resume activities as tolerated. 2. No intercourse for another 5 weeks 3. Return in 5 weeks for final postop check.  Brayton Mars, MD  Note: This dictation was prepared with Dragon dictation along with smaller phrase technology. Any transcriptional errors that result from this process are unintentional.

## 2015-07-06 ENCOUNTER — Ambulatory Visit (INDEPENDENT_AMBULATORY_CARE_PROVIDER_SITE_OTHER): Payer: Managed Care, Other (non HMO) | Admitting: Obstetrics and Gynecology

## 2015-07-06 ENCOUNTER — Encounter: Payer: Self-pay | Admitting: Obstetrics and Gynecology

## 2015-07-06 VITALS — BP 147/84 | HR 76 | Ht 63.0 in | Wt 143.2 lb

## 2015-07-06 DIAGNOSIS — D069 Carcinoma in situ of cervix, unspecified: Secondary | ICD-10-CM

## 2015-07-06 DIAGNOSIS — Z09 Encounter for follow-up examination after completed treatment for conditions other than malignant neoplasm: Secondary | ICD-10-CM

## 2015-07-06 DIAGNOSIS — N921 Excessive and frequent menstruation with irregular cycle: Secondary | ICD-10-CM

## 2015-07-06 DIAGNOSIS — Z90710 Acquired absence of both cervix and uterus: Secondary | ICD-10-CM

## 2015-07-19 NOTE — Patient Instructions (Signed)
1. Resume all activities without restriction 2. Return in 6 months for follow-up or as needed

## 2015-07-19 NOTE — Progress Notes (Signed)
Chief complaint: 1. Six-week postop check-final Status post LAVH bilateral salpingectomy  Patient is doing well postoperatively. Bowel and bladder function are normal. She is not having any significant pelvic pain at this time. She is not experiencing any vaginal discharge or vaginal bleeding.  Past medical history, past surgical history, problem list, medications, and allergies are reviewed  OBJECTIVE: BP 147/84 mmHg  Pulse 76  Ht 5' 3"  (1.6 m)  Wt 143 lb 3.2 oz (64.955 kg)  BMI 25.37 kg/m2  LMP 04/16/2015 Pleasant well-appearing white female in no acute distress Abdomen: Soft, nontender without organomegaly. Laparoscopy incisions are well-healed. Pelvic: External genitalia normal BUS-normal Vagina-good vault support; vaginal cuff is intact with a 5 mm area of granulation still healing; bimanual exam reveals no tenderness Cervix-surgically absent Uterus Surgically absent Rectovaginal- normal external exam  ASSESSMENT: 1. Status post LAVH bilateral salpingectomy for high-grade dysplasia 2. Normal postop check  PLAN: 1. Resume all activities without restriction 2. Return in 6 months for follow-up or as needed  Brayton Mars, MD  Note: This dictation was prepared with Dragon dictation along with smaller phrase technology. Any transcriptional errors that result from this process are unintentional.

## 2015-08-11 ENCOUNTER — Other Ambulatory Visit: Payer: Self-pay | Admitting: Obstetrics and Gynecology

## 2015-09-02 ENCOUNTER — Ambulatory Visit (INDEPENDENT_AMBULATORY_CARE_PROVIDER_SITE_OTHER): Payer: Managed Care, Other (non HMO) | Admitting: Obstetrics and Gynecology

## 2015-09-02 ENCOUNTER — Encounter: Payer: Self-pay | Admitting: Obstetrics and Gynecology

## 2015-09-02 VITALS — BP 147/84 | HR 98 | Wt 145.2 lb

## 2015-09-02 DIAGNOSIS — Z09 Encounter for follow-up examination after completed treatment for conditions other than malignant neoplasm: Secondary | ICD-10-CM

## 2015-09-02 NOTE — Progress Notes (Signed)
Subjective:     Patient ID: Dawn Rivera, female   DOB: 05-21-1975, 40 y.o.   MRN: [id number]  HPI At 6 weeks postop check Dr Orlene Plum noted granulation tissue at vaginal cuff and treated. Has not seen any more bleeding since then. No pain.  Review of Systems See above    Objective:   Physical Exam A&O x4  well groomed female in no distress Blood pressure 147/84, pulse 98, weight 145 lb 3.2 oz (65.862 kg), last menstrual period 04/16/2015. Pelvic exam: VULVA: normal appearing vulva with no masses, tenderness or lesions, VAGINA: normal appearing vagina with normal color and discharge, no lesions, CERVIX: surgically absent, no granulation tissue noted.    Assessment:     Full resolution of granulation tissue     Plan:     Reassured. RTC prn or at AE time.  Yisrael Obryan Oakley, CNM

## 2015-10-11 ENCOUNTER — Encounter: Payer: Self-pay | Admitting: Obstetrics and Gynecology

## 2015-10-12 ENCOUNTER — Other Ambulatory Visit: Payer: Self-pay | Admitting: *Deleted

## 2015-10-12 MED ORDER — XANAX 1 MG PO TABS: 1.0000 mg | ORAL_TABLET | Freq: Three times a day (TID) | ORAL | 2 refills | Status: DC | PRN

## 2016-01-21 ENCOUNTER — Other Ambulatory Visit: Payer: Self-pay | Admitting: Obstetrics and Gynecology

## 2016-03-07 ENCOUNTER — Ambulatory Visit (INDEPENDENT_AMBULATORY_CARE_PROVIDER_SITE_OTHER): Payer: Managed Care, Other (non HMO) | Admitting: Obstetrics and Gynecology

## 2016-03-07 ENCOUNTER — Encounter: Payer: Self-pay | Admitting: Obstetrics and Gynecology

## 2016-03-07 ENCOUNTER — Other Ambulatory Visit: Payer: Self-pay | Admitting: Obstetrics and Gynecology

## 2016-03-07 VITALS — BP 123/84 | HR 74 | Ht 63.0 in | Wt 145.9 lb

## 2016-03-07 DIAGNOSIS — Z01419 Encounter for gynecological examination (general) (routine) without abnormal findings: Secondary | ICD-10-CM

## 2016-03-07 NOTE — Patient Instructions (Signed)
Place annual gynecologic exam patient instructions here.

## 2016-03-07 NOTE — Progress Notes (Signed)
Subjective:   Dawn Rivera is a 41 y.o. [phone] Caucasian female here for a routine well-woman exam.  Patient's last menstrual period was 04/16/2015.    Current complaints: none PCP: me       does desire labs  Social History: Sexual: heterosexual Marital Status: married Living situation: with family Occupation: unknown occupation Tobacco/alcohol: no tobacco use Illicit drugs: no history of illicit drug use  The following portions of the patient's history were reviewed and updated as appropriate: allergies, current medications, past family history, past medical history, past social history, past surgical history and problem list.  Past Medical History Past Medical History:  Diagnosis Date  . Anxiety   . Cancer (HCC)    cervical  . Depression   . HPV (human papilloma virus) anogenital infection   . Vaginal Pap smear, abnormal     Past Surgical History Past Surgical History:  Procedure Laterality Date  . AUGMENTATION MAMMAPLASTY Bilateral    breast implants  . BREAST ENHANCEMENT SURGERY  2003  . CYSTOSCOPY  05/24/2015   Procedure: CYSTOSCOPY;  Surgeon: Brayton Mars, MD;  Location: [facility];  Service: Gynecology;;  . LAPAROSCOPIC VAGINAL HYSTERECTOMY WITH SALPINGECTOMY Bilateral 05/24/2015   Procedure: LAPAROSCOPIC ASSISTED VAGINAL HYSTERECTOMY WITH BILATERAL SALPINGECTOMY;  Surgeon: Brayton Mars, MD;  Location: [facility];  Service: Gynecology;  Laterality: Bilateral;  . LEEP N/A 04/12/2015   Procedure: LOOP ELECTROSURGICAL EXCISION PROCEDURE (LEEP);  Surgeon: Brayton Mars, MD;  Location: [facility];  Service: Gynecology;  Laterality: N/A;    Gynecologic History [id number]  Patient's last menstrual period was 04/16/2015. Contraception: status post hysterectomy Last Pap: 2016. Results were: LGSIL +HPV Last mammogram: 2016. Results were: normal  Obstetric History OB History  Gravida Para Term Preterm AB Living  4 3 3   1 3   SAB TAB Ectopic Multiple  Live Births  1       3    # Outcome Date GA Lbr Len/2nd Weight Sex Delivery Anes PTL Lv  4 Term 2003   7 lb 4.8 oz (3.311 kg) M Vag-Spont   LIV  3 SAB 2002          2 Term 1999   7 lb 8 oz (3.402 kg) F Vag-Spont   LIV  1 Term 1995   6 lb 4.8 oz (2.858 kg) M Vag-Spont   LIV      Current Medications Current Outpatient Prescriptions on File Prior to Visit  Medication Sig Dispense Refill  . Ascorbic Acid (VITAMIN C) 100 MG tablet Take 100 mg by mouth daily. Reported on 09/02/2015    . sertraline (ZOLOFT) 100 MG tablet TAKE 1 TABLET TWICE A DAY 180 tablet 2  . traZODone (DESYREL) 100 MG tablet Take 1 tablet (100 mg total) by mouth at bedtime. 30 tablet 2  . valACYclovir (VALTREX) 1000 MG tablet Take 1 tablet (1,000 mg total) by mouth daily. 90 tablet 2  . XANAX 1 MG tablet Take 1 tablet (1 mg total) by mouth 3 (three) times daily as needed for anxiety. 90 tablet 2  . ibuprofen (ADVIL,MOTRIN) 800 MG tablet Take 1 tablet (800 mg total) by mouth 3 (three) times daily. (Patient not taking: Reported on 03/07/2016) 30 tablet 1   Current Facility-Administered Medications on File Prior to Visit  Medication Dose Route Frequency Provider Last Rate Last Dose  . alfentanil (ALFENTA) 500 MCG/ML injection   Intravenous Anesthesia Intra-op Allean Found, CRNA   750 mcg at 05/24/15 0826  . midazolam (VERSED) injection  Intravenous Anesthesia Intra-op Allean Found, CRNA   1 mg at 05/24/15 0825    Review of Systems Patient denies any headaches, blurred vision, shortness of breath, chest pain, abdominal pain, problems with bowel movements, urination, or intercourse.  Objective:  BP 123/84   Pulse 74   Ht 5' 3"  (1.6 m)   Wt 145 lb 14.4 oz (66.2 kg)   LMP 04/16/2015   BMI 25.85 kg/m  Physical Exam  General:  Well developed, well nourished, no acute distress. She is alert and oriented x3. Skin:  Warm and dry Neck:  Midline trachea, no thyromegaly or nodules Cardiovascular: Regular rate and rhythm, no  murmur heard Lungs:  Effort normal, all lung fields clear to auscultation bilaterally Breasts:  No dominant palpable mass, retraction, or nipple discharge; bilateral implants noted. Abdomen:  Soft, non tender, no hepatosplenomegaly or masses Pelvic:  External genitalia is normal in appearance.  The vagina is normal in appearance. The cervix is bulbous, no CMT.  Thin prep pap is done with HR HPV cotesting. Uterus is surgically absent.  No adnexal masses or tenderness noted. Extremities:  No swelling or varicosities noted Psych:  She has a normal mood and affect  Assessment:   Healthy well-woman exam S/p hysterectomy  Plan:  Labs and MMG ordered F/U 1 year for AE, or sooner if needed   Hilda Wexler Rockney Ghee, CNM

## 2016-03-08 LAB — COMPREHENSIVE METABOLIC PANEL
ALT: 16 IU/L (ref 0–32)
AST: 16 IU/L (ref 0–40)
Albumin/Globulin Ratio: 1.7 (ref 1.2–2.2)
Albumin: 4.8 g/dL (ref 3.5–5.5)
Alkaline Phosphatase: 79 IU/L (ref 39–117)
BUN/Creatinine Ratio: 11 (ref 9–23)
BUN: 11 mg/dL (ref 6–24)
Bilirubin Total: 0.2 mg/dL (ref 0.0–1.2)
CO2: 25 mmol/L (ref 18–29)
CREATININE: 1.03 mg/dL — AB (ref 0.57–1.00)
Calcium: 9.9 mg/dL (ref 8.7–10.2)
Chloride: 99 mmol/L (ref 96–106)
GFR calc Af Amer: 77 mL/min/{1.73_m2} (ref >59)
GFR, EST NON AFRICAN AMERICAN: 67 mL/min/{1.73_m2} (ref >59)
GLOBULIN, TOTAL: 2.9 g/dL (ref 1.5–4.5)
Glucose: 85 mg/dL (ref 65–99)
Potassium: 4.2 mmol/L (ref 3.5–5.2)
Sodium: 139 mmol/L (ref 134–144)
TOTAL PROTEIN: 7.7 g/dL (ref 6.0–8.5)

## 2016-03-08 LAB — LIPID PANEL
CHOL/HDL RATIO: 3.4 ratio (ref 0.0–4.4)
CHOLESTEROL TOTAL: 189 mg/dL (ref 100–199)
HDL: 56 mg/dL (ref >39)
LDL Calculated: 100 mg/dL — ABNORMAL HIGH (ref 0–99)
Triglycerides: 163 mg/dL — ABNORMAL HIGH (ref 0–149)
VLDL Cholesterol Cal: 33 mg/dL (ref 5–40)

## 2016-03-09 LAB — CYTOLOGY - PAP

## 2016-05-09 ENCOUNTER — Other Ambulatory Visit: Payer: Self-pay | Admitting: Obstetrics and Gynecology

## 2016-06-21 ENCOUNTER — Encounter: Payer: Self-pay | Admitting: Obstetrics and Gynecology

## 2016-06-21 ENCOUNTER — Other Ambulatory Visit: Payer: Self-pay | Admitting: *Deleted

## 2016-06-21 MED ORDER — XANAX 1 MG PO TABS: 1.0000 mg | ORAL_TABLET | Freq: Three times a day (TID) | ORAL | 2 refills | Status: DC | PRN

## 2016-06-21 MED ORDER — SERTRALINE HCL 100 MG PO TABS: 100.0000 mg | ORAL_TABLET | Freq: Two times a day (BID) | ORAL | 2 refills | Status: DC

## 2016-06-21 MED ORDER — VALACYCLOVIR HCL 1 G PO TABS: 1000.0000 mg | ORAL_TABLET | Freq: Every day | ORAL | 2 refills | Status: DC

## 2016-06-22 ENCOUNTER — Telehealth: Payer: Self-pay | Admitting: Certified Nurse Midwife

## 2016-06-22 NOTE — Telephone Encounter (Signed)
Insurance doesn't cover the Xanax 73m Please call with alternative medication  Ref# [id number]

## 2016-06-23 NOTE — Telephone Encounter (Signed)
Pt pharmacy will cover the generic of Xanax (Alprazolam) but Rx is DAW or a PA will be required. Pt states the generic is fine, that is what she takes. Pharmacy notified, Rx given to pharmacy for the generic.

## 2016-07-11 ENCOUNTER — Ambulatory Visit
Admission: RE | Admit: 2016-07-11 | Discharge: 2016-07-11 | Disposition: A | Discharge status: Home / Self Care | Payer: Managed Care, Other (non HMO) | Source: Ambulatory Visit | Attending: Obstetrics and Gynecology | Admitting: Obstetrics and Gynecology

## 2016-07-11 DIAGNOSIS — Z01419 Encounter for gynecological examination (general) (routine) without abnormal findings: Secondary | ICD-10-CM

## 2016-07-11 DIAGNOSIS — Z1231 Encounter for screening mammogram for malignant neoplasm of breast: Secondary | ICD-10-CM | POA: Insufficient documentation

## 2016-08-20 ENCOUNTER — Encounter: Payer: Self-pay | Admitting: Obstetrics and Gynecology

## 2016-08-21 ENCOUNTER — Encounter: Payer: Self-pay | Admitting: Certified Nurse Midwife

## 2016-08-21 ENCOUNTER — Other Ambulatory Visit: Payer: Self-pay | Admitting: *Deleted

## 2016-08-21 ENCOUNTER — Ambulatory Visit (INDEPENDENT_AMBULATORY_CARE_PROVIDER_SITE_OTHER): Payer: Managed Care, Other (non HMO) | Admitting: Certified Nurse Midwife

## 2016-08-21 VITALS — BP 112/68 | HR 88 | Wt 146.7 lb

## 2016-08-21 DIAGNOSIS — L209 Atopic dermatitis, unspecified: Secondary | ICD-10-CM

## 2016-08-21 MED ORDER — TRIAMCINOLONE ACETONIDE 0.5 % EX CREA: 1.0000 "application " | TOPICAL_CREAM | Freq: Three times a day (TID) | CUTANEOUS | 0 refills | Status: DC

## 2016-08-21 MED ORDER — PREDNISONE 5 MG PO TABS: 5.0000 mg | ORAL_TABLET | Freq: Every day | ORAL | 0 refills | Status: DC

## 2016-08-21 NOTE — Progress Notes (Signed)
GYN ENCOUNTER NOTE  Subjective:       Dawn Rivera is a 41 y.o. [phone] female is here for gynecologic evaluation of the following issues:  1. Rash on her arms, stomach, and legs. She states that she has a history of stress rash that she has been treated for in the past by a dermotolgist. She thought that she had been exposed to poison oak last week and had been treating several small patches topically of the affected area but has started to develop itchy and rash since last Wednesday that has progressively gotten worse. She states that she has tried new shampoo and has also tried shea butter. She has since stopped using both but the rash has progressed to her stomach and now her legs. She has used steroids previously and that worked for her in the past.     Obstetric History OB History  Gravida Para Term Preterm AB Living  4 3 3   1 3   SAB TAB Ectopic Multiple Live Births  1       3    # Outcome Date GA Lbr Len/2nd Weight Sex Delivery Anes PTL Lv  4 Term 2003   7 lb 4.8 oz (3.311 kg) M Vag-Spont   LIV  3 SAB 2002          2 Term 1999   7 lb 8 oz (3.402 kg) F Vag-Spont   LIV  1 Term 1995   6 lb 4.8 oz (2.858 kg) M Vag-Spont   LIV      Past Medical History:  Diagnosis Date  . Anxiety   . Cancer (HCC)    cervical  . Depression   . HPV (human papilloma virus) anogenital infection   . Vaginal Pap smear, abnormal     Past Surgical History:  Procedure Laterality Date  . AUGMENTATION MAMMAPLASTY Bilateral    breast implants  . BREAST ENHANCEMENT SURGERY  2003  . CYSTOSCOPY  05/24/2015   Procedure: CYSTOSCOPY;  Surgeon: Brayton Mars, MD;  Location: [facility];  Service: Gynecology;;  . LAPAROSCOPIC VAGINAL HYSTERECTOMY WITH SALPINGECTOMY Bilateral 05/24/2015   Procedure: LAPAROSCOPIC ASSISTED VAGINAL HYSTERECTOMY WITH BILATERAL SALPINGECTOMY;  Surgeon: Brayton Mars, MD;  Location: [facility];  Service: Gynecology;  Laterality: Bilateral;  . LEEP N/A 04/12/2015   Procedure: LOOP ELECTROSURGICAL EXCISION PROCEDURE (LEEP);  Surgeon: Brayton Mars, MD;  Location: [facility];  Service: Gynecology;  Laterality: N/A;    Current Outpatient Prescriptions on File Prior to Visit  Medication Sig Dispense Refill  . Ascorbic Acid (VITAMIN C) 100 MG tablet Take 100 mg by mouth daily. Reported on 09/02/2015    . sertraline (ZOLOFT) 100 MG tablet Take 1 tablet (100 mg total) by mouth 2 (two) times daily. 180 tablet 2  . traZODone (DESYREL) 100 MG tablet TAKE 1 TABLET AT BEDTIME AS NEEDED 90 tablet 4  . valACYclovir (VALTREX) 1000 MG tablet Take 1 tablet (1,000 mg total) by mouth daily. 90 tablet 2  . XANAX 1 MG tablet Take 1 tablet (1 mg total) by mouth 3 (three) times daily as needed for anxiety. 90 tablet 2  . ibuprofen (ADVIL,MOTRIN) 800 MG tablet Take 1 tablet (800 mg total) by mouth 3 (three) times daily. (Patient not taking: Reported on 03/07/2016) 30 tablet 1   Current Facility-Administered Medications on File Prior to Visit  Medication Dose Route Frequency Provider Last Rate Last Dose  . alfentanil (ALFENTA) 500 MCG/ML injection   Intravenous Anesthesia Intra-op Allean Found, CRNA  750 mcg at 05/24/15 0826  . midazolam (VERSED) injection   Intravenous Anesthesia Intra-op Allean Found, CRNA   1 mg at 05/24/15 0825    No Known Allergies  Social History   Social History  . Marital status: Married    Spouse name: N/A  . Number of children: N/A  . Years of education: N/A   Occupational History  . Not on file.   Social History Main Topics  . Smoking status: Never Smoker  . Smokeless tobacco: Never Used  . Alcohol use Yes     Comment: occas  . Drug use: No  . Sexual activity: Yes    Birth control/ protection: Surgical   Other Topics Concern  . Not on file   Social History Narrative  . No narrative on file    Family History  Problem Relation Age of Onset  . Breast cancer Maternal Aunt   . Heart disease Mother   . Colon cancer Maternal  Grandmother   . Ovarian cancer Maternal Grandfather   . Diabetes Neg Hx     The following portions of the patient's history were reviewed and updated as appropriate: allergies, current medications, past family history, past medical history, past social history, past surgical history and problem list.  Review of Systems Review of Systems - Negative except small red papuals present on arms, abdomen and legs Review of Systems - General ROS: negative for - chills, fatigue, fever, hot flashes, malaise or night sweats Hematological and Lymphatic ROS: negative for - bleeding problems or swollen lymph nodes Gastrointestinal ROS: negative for - abdominal pain, blood in stools, change in bowel habits and nausea/vomiting Musculoskeletal ROS: negative for - joint pain, muscle pain or muscular weakness Genito-Urinary ROS: negative for - change in menstrual cycle, dysmenorrhea, dyspareunia, dysuria, genital discharge, genital ulcers, hematuria, incontinence, irregular/heavy menses, nocturia or pelvic painjj  Objective:   BP 112/68   Pulse 88   Wt 146 lb 11.2 oz (66.5 kg)   LMP 04/16/2015   BMI 25.99 kg/m  CONSTITUTIONAL: Well-developed, well-nourished female in no acute distress.  HENT:  Normocephalic, atraumatic.  NECK: Normal range of motion, supple, no masses.  Normal thyroid.  SKIN: Skin is warm and dry. Not diaphoretic. No pallor. Rash present: Arms, stomach, and legs.  [place]: Alert and oriented to person, place, and time.  PSYCHIATRIC: Normal mood and affect. Normal behavior. Normal judgment and thought content. CARDIOVASCULAR:Not Examined RESPIRATORY: Not Examined BREASTS: Not Examined ABDOMEN: Soft, non tender. Rash present PELVIC: not examined MUSCULOSKELETAL: Normal range of motion. No tenderness.  No cyanosis, clubbing, or edema.  Assessment:   Atopic Dermatitis     Plan:   Use benadryl , cool bath , and prednisone taper as discussed. She is declining steroid creams  stating that the shot or oral steroids works better for her.  Red flag symptoms reviewed as well as use of prednisone taper. Follow up as needed.  Philip Aspen, CNM

## 2016-08-21 NOTE — Patient Instructions (Signed)

## 2016-09-25 ENCOUNTER — Other Ambulatory Visit: Payer: Self-pay | Admitting: Obstetrics and Gynecology

## 2016-10-15 ENCOUNTER — Encounter: Payer: Self-pay | Admitting: Obstetrics and Gynecology

## 2016-10-26 ENCOUNTER — Encounter: Payer: Self-pay | Admitting: Obstetrics and Gynecology

## 2017-02-19 ENCOUNTER — Other Ambulatory Visit: Payer: Self-pay | Admitting: Obstetrics and Gynecology

## 2017-03-13 ENCOUNTER — Encounter: Payer: Managed Care, Other (non HMO) | Admitting: Obstetrics and Gynecology

## 2017-03-15 ENCOUNTER — Encounter: Payer: Self-pay | Admitting: Obstetrics and Gynecology

## 2017-04-24 ENCOUNTER — Other Ambulatory Visit: Payer: Self-pay | Admitting: *Deleted

## 2017-04-24 MED ORDER — AMOXICILLIN-POT CLAVULANATE 875-125 MG PO TABS: 1.0000 | ORAL_TABLET | Freq: Two times a day (BID) | ORAL | 0 refills | Status: DC

## 2017-05-10 ENCOUNTER — Encounter: Payer: Self-pay | Admitting: Obstetrics and Gynecology

## 2017-05-21 ENCOUNTER — Encounter: Payer: Self-pay | Admitting: Obstetrics and Gynecology

## 2017-07-11 ENCOUNTER — Encounter: Payer: Self-pay | Admitting: Obstetrics and Gynecology

## 2017-07-11 ENCOUNTER — Ambulatory Visit (INDEPENDENT_AMBULATORY_CARE_PROVIDER_SITE_OTHER): Payer: Managed Care, Other (non HMO) | Admitting: Obstetrics and Gynecology

## 2017-07-11 ENCOUNTER — Other Ambulatory Visit: Payer: Self-pay | Admitting: Obstetrics and Gynecology

## 2017-07-11 VITALS — Ht 63.0 in | Wt 144.7 lb

## 2017-07-11 DIAGNOSIS — Z01419 Encounter for gynecological examination (general) (routine) without abnormal findings: Secondary | ICD-10-CM

## 2017-07-11 MED ORDER — VALACYCLOVIR HCL 1 G PO TABS: 1000.0000 mg | ORAL_TABLET | Freq: Every day | ORAL | 2 refills | Status: DC

## 2017-07-11 MED ORDER — CYANOCOBALAMIN 1000 MCG/ML IJ SOLN: 1000.0000 ug | INTRAMUSCULAR | 1 refills | Status: DC

## 2017-07-11 MED ORDER — SERTRALINE HCL 100 MG PO TABS: 100.0000 mg | ORAL_TABLET | Freq: Two times a day (BID) | ORAL | 2 refills | Status: DC

## 2017-07-11 NOTE — Progress Notes (Signed)
Subjective:   COTINA FREEDMAN is a 42 y.o. [phone] Caucasian female here for a routine well-woman exam.  Patient's last menstrual period was 04/16/2015.    Current complaints: occasional anxiety- xanax still helps. Also concerned about weight gain and desires trial of weight loss medication. PCP: me       does desire labs  Social History: Sexual: heterosexual Marital Status: married Living situation: with family Occupation: unknown occupation Tobacco/alcohol: no tobacco use Illicit drugs: no history of illicit drug use  The following portions of the patient's history were reviewed and updated as appropriate: allergies, current medications, past family history, past medical history, past social history, past surgical history and problem list.  Past Medical History Past Medical History:  Diagnosis Date  . Anxiety   . Cancer (HCC)    cervical  . Depression   . HPV (human papilloma virus) anogenital infection   . Vaginal Pap smear, abnormal     Past Surgical History Past Surgical History:  Procedure Laterality Date  . AUGMENTATION MAMMAPLASTY Bilateral    breast implants  . BREAST ENHANCEMENT SURGERY  2003  . CYSTOSCOPY  05/24/2015   Procedure: CYSTOSCOPY;  Surgeon: Brayton Mars, MD;  Location: [facility];  Service: Gynecology;;  . LAPAROSCOPIC VAGINAL HYSTERECTOMY WITH SALPINGECTOMY Bilateral 05/24/2015   Procedure: LAPAROSCOPIC ASSISTED VAGINAL HYSTERECTOMY WITH BILATERAL SALPINGECTOMY;  Surgeon: Brayton Mars, MD;  Location: [facility];  Service: Gynecology;  Laterality: Bilateral;  . LEEP N/A 04/12/2015   Procedure: LOOP ELECTROSURGICAL EXCISION PROCEDURE (LEEP);  Surgeon: Brayton Mars, MD;  Location: [facility];  Service: Gynecology;  Laterality: N/A;    Gynecologic History [id number]  Patient's last menstrual period was 04/16/2015. Contraception: status post hysterectomy Last Pap: 02/2016. Results were: normal Last mammogram 06/2016 Results were  normal.   Obstetric History OB History  Gravida Para Term Preterm AB Living  4 3 3   1 3   SAB TAB Ectopic Multiple Live Births  1       3    # Outcome Date GA Lbr Len/2nd Weight Sex Delivery Anes PTL Lv  4 Term 2003   7 lb 4.8 oz (3.311 kg) M Vag-Spont   LIV  3 SAB 2002          2 Term 1999   7 lb 8 oz (3.402 kg) F Vag-Spont   LIV  1 Term 1995   6 lb 4.8 oz (2.858 kg) M Vag-Spont   LIV    Current Medications Current Outpatient Medications on File Prior to Visit  Medication Sig Dispense Refill  . IBU 800 MG tablet TAKE 1 TABLET THREE TIMES A DAY 50 tablet 2  . sertraline (ZOLOFT) 100 MG tablet Take 1 tablet (100 mg total) by mouth 2 (two) times daily. 180 tablet 2  . valACYclovir (VALTREX) 1000 MG tablet Take 1 tablet (1,000 mg total) by mouth daily. 90 tablet 2  . XANAX 1 MG tablet Take 1 tablet (1 mg total) by mouth 3 (three) times daily as needed for anxiety. 90 tablet 2  . amoxicillin-clavulanate (AUGMENTIN) 875-125 MG tablet Take 1 tablet by mouth 2 (two) times daily. 20 tablet 0  . Ascorbic Acid (VITAMIN C) 100 MG tablet Take 100 mg by mouth daily. Reported on 09/02/2015    . predniSONE (DELTASONE) 5 MG tablet Take 1 tablet (5 mg total) by mouth daily with breakfast. 6, 5, 4, 3,2,1- taper pack (Patient not taking: Reported on 07/11/2017) 21 tablet 0   No current facility-administered medications on  file prior to visit.     Review of Systems Patient denies any headaches, blurred vision, shortness of breath, chest pain, abdominal pain, problems with bowel movements, urination, or intercourse.  Objective:  Ht 5' 3"  (1.6 m)   Wt 144 lb 11.2 oz (65.6 kg)   LMP 04/16/2015   BMI 25.63 kg/m  Physical Exam  General:  Well developed, well nourished, no acute distress. She is alert and oriented x3. Skin:  Warm and dry Neck:  Midline trachea, no thyromegaly or nodules Cardiovascular: Regular rate and rhythm, no murmur heard Lungs:  Effort normal, all lung fields clear to  auscultation bilaterally Breasts:  No dominant palpable mass, retraction, or nipple discharge bilateral implants noted without defect. Abdomen:  Soft, non tender, no hepatosplenomegaly or masses Pelvic:  External genitalia is normal in appearance.  The vagina is normal in appearance. The cervix is bulbous, no CMT.  Thin prep pap is not done . Uterus is surgically absent No adnexal masses or tenderness noted. Extremities:  No swelling or varicosities noted Psych:  She has a normal mood and affect  Assessment:   Healthy well-woman exam S/p hysterectomy SSRI use Overweight Needs Tdap  Plan:  Labs obtained- will follow up accordingly Tdap given Weight loss program started. RTC 1 month for weight/bp/B12 F/U 1 year for AE, or sooner if needed Mammogram ordered  Rosaura Bolon Rockney Ghee, CNM

## 2017-07-11 NOTE — Patient Instructions (Signed)
Preventive Care 18-39 Years, Female Preventive care refers to lifestyle choices and visits with your health care provider that can promote health and wellness. What does preventive care include?  A yearly physical exam. This is also called an annual well check.  Dental exams once or twice a year.  Routine eye exams. Ask your health care provider how often you should have your eyes checked.  Personal lifestyle choices, including: ? Daily care of your teeth and gums. ? Regular physical activity. ? Eating a healthy diet. ? Avoiding tobacco and drug use. ? Limiting alcohol use. ? Practicing safe sex. ? Taking vitamin and mineral supplements as recommended by your health care provider. What happens during an annual well check? The services and screenings done by your health care provider during your annual well check will depend on your age, overall health, lifestyle risk factors, and family history of disease. Counseling Your health care provider may ask you questions about your:  Alcohol use.  Tobacco use.  Drug use.  Emotional well-being.  Home and relationship well-being.  Sexual activity.  Eating habits.  Work and work Statistician.  Method of birth control.  Menstrual cycle.  Pregnancy history.  Screening You may have the following tests or measurements:  Height, weight, and BMI.  Diabetes screening. This is done by checking your blood sugar (glucose) after you have not eaten for a while (fasting).  Blood pressure.  Lipid and cholesterol levels. These may be checked every 5 years starting at age 38.  Skin check.  Hepatitis C blood test.  Hepatitis B blood test.  Sexually transmitted disease (STD) testing.  BRCA-related cancer screening. This may be done if you have a family history of breast, ovarian, tubal, or peritoneal cancers.  Pelvic exam and Pap test. This may be done every 3 years starting at age 38. Starting at age 30, this may be done  every 5 years if you have a Pap test in combination with an HPV test.  Discuss your test results, treatment options, and if necessary, the need for more tests with your health care provider. Vaccines Your health care provider may recommend certain vaccines, such as:  Influenza vaccine. This is recommended every year.  Tetanus, diphtheria, and acellular pertussis (Tdap, Td) vaccine. You may need a Td booster every 10 years.  Varicella vaccine. You may need this if you have not been vaccinated.  HPV vaccine. If you are 39 or younger, you may need three doses over 6 months.  Measles, mumps, and rubella (MMR) vaccine. You may need at least one dose of MMR. You may also need a second dose.  Pneumococcal 13-valent conjugate (PCV13) vaccine. You may need this if you have certain conditions and were not previously vaccinated.  Pneumococcal polysaccharide (PPSV23) vaccine. You may need one or two doses if you smoke cigarettes or if you have certain conditions.  Meningococcal vaccine. One dose is recommended if you are age 68-21 years and a first-year college student living in a residence hall, or if you have one of several medical conditions. You may also need additional booster doses.  Hepatitis A vaccine. You may need this if you have certain conditions or if you travel or work in places where you may be exposed to hepatitis A.  Hepatitis B vaccine. You may need this if you have certain conditions or if you travel or work in places where you may be exposed to hepatitis B.  Haemophilus influenzae type b (Hib) vaccine. You may need this  if you have certain risk factors.  Talk to your health care provider about which screenings and vaccines you need and how often you need them. This information is not intended to replace advice given to you by your health care provider. Make sure you discuss any questions you have with your health care provider. Document Released: 04/11/2001 Document Revised:  11/03/2015 Document Reviewed: 12/15/2014 Elsevier Interactive Patient Education  2018 Elsevier Inc.  

## 2017-07-12 LAB — COMPREHENSIVE METABOLIC PANEL
A/G RATIO: 1.8 (ref 1.2–2.2)
ALT: 15 IU/L (ref 0–32)
AST: 16 IU/L (ref 0–40)
Albumin: 4.8 g/dL (ref 3.5–5.5)
Alkaline Phosphatase: 84 IU/L (ref 39–117)
BILIRUBIN TOTAL: 0.3 mg/dL (ref 0.0–1.2)
BUN/Creatinine Ratio: 10 (ref 9–23)
BUN: 11 mg/dL (ref 6–24)
CALCIUM: 9.9 mg/dL (ref 8.7–10.2)
CO2: 24 mmol/L (ref 20–29)
Chloride: 100 mmol/L (ref 96–106)
Creatinine, Ser: 1.05 mg/dL — ABNORMAL HIGH (ref 0.57–1.00)
GFR calc Af Amer: 75 mL/min/{1.73_m2} (ref >59)
GFR, EST NON AFRICAN AMERICAN: 65 mL/min/{1.73_m2} (ref >59)
Globulin, Total: 2.7 g/dL (ref 1.5–4.5)
Glucose: 86 mg/dL (ref 65–99)
POTASSIUM: 4.3 mmol/L (ref 3.5–5.2)
SODIUM: 140 mmol/L (ref 134–144)
Total Protein: 7.5 g/dL (ref 6.0–8.5)

## 2017-07-12 LAB — LIPID PANEL
CHOL/HDL RATIO: 3.5 ratio (ref 0.0–4.4)
Cholesterol, Total: 194 mg/dL (ref 100–199)
HDL: 55 mg/dL (ref >39)
LDL CALC: 111 mg/dL — AB (ref 0–99)
Triglycerides: 141 mg/dL (ref 0–149)
VLDL Cholesterol Cal: 28 mg/dL (ref 5–40)

## 2017-07-12 LAB — THYROID PANEL WITH TSH
Free Thyroxine Index: 1.5 (ref 1.2–4.9)
T3 Uptake Ratio: 26 % (ref 24–39)
T4 TOTAL: 5.7 ug/dL (ref 4.5–12.0)
TSH: 3.62 u[IU]/mL (ref 0.450–4.500)

## 2017-08-06 ENCOUNTER — Encounter: Payer: Self-pay | Admitting: Obstetrics and Gynecology

## 2017-08-06 ENCOUNTER — Ambulatory Visit (INDEPENDENT_AMBULATORY_CARE_PROVIDER_SITE_OTHER): Payer: Managed Care, Other (non HMO) | Admitting: Certified Nurse Midwife

## 2017-08-06 VITALS — BP 130/88 | HR 98 | Ht 63.0 in | Wt 142.4 lb

## 2017-08-06 DIAGNOSIS — E663 Overweight: Secondary | ICD-10-CM

## 2017-08-06 MED ORDER — CYANOCOBALAMIN 1000 MCG/ML IJ SOLN
1000.0000 ug | Freq: Once | INTRAMUSCULAR | Status: DC
Start: 2017-08-06 — End: 2022-12-15

## 2017-08-06 MED ORDER — CYANOCOBALAMIN 1000 MCG/ML IJ SOLN
1000.0000 ug | Freq: Once | INTRAMUSCULAR | Status: AC
  Administered 2017-08-06: 1000 ug via INTRAMUSCULAR

## 2017-08-06 NOTE — Progress Notes (Signed)
Pt is here for wt, bp check, b-12 inj She is doing well, denies any s/e , has "a lot of stress going on at work"  08/06/17 wt- 142.4lb 07/11/17 wt- 144lb

## 2017-09-03 ENCOUNTER — Encounter: Payer: Self-pay | Admitting: Obstetrics and Gynecology

## 2017-09-03 ENCOUNTER — Ambulatory Visit (INDEPENDENT_AMBULATORY_CARE_PROVIDER_SITE_OTHER): Payer: Managed Care, Other (non HMO) | Admitting: Obstetrics and Gynecology

## 2017-09-03 VITALS — BP 128/88 | HR 81 | Ht 63.0 in | Wt 138.9 lb

## 2017-09-03 DIAGNOSIS — E663 Overweight: Secondary | ICD-10-CM

## 2017-09-03 MED ORDER — CYANOCOBALAMIN 1000 MCG/ML IJ SOLN
1000.0000 ug | Freq: Once | INTRAMUSCULAR | Status: AC
  Administered 2017-09-03: 1000 ug via INTRAMUSCULAR

## 2017-09-03 NOTE — Progress Notes (Signed)
Pt is here for wt, bp check ,-b12 inj She is doing well, denies any s/e  09/03/17 wt- 138.9lb 08/06/17 wt- 142lb

## 2017-09-18 ENCOUNTER — Other Ambulatory Visit: Payer: Self-pay | Admitting: Obstetrics and Gynecology

## 2017-10-01 ENCOUNTER — Encounter: Payer: Self-pay | Admitting: Obstetrics and Gynecology

## 2017-10-01 ENCOUNTER — Ambulatory Visit (INDEPENDENT_AMBULATORY_CARE_PROVIDER_SITE_OTHER): Payer: Managed Care, Other (non HMO) | Admitting: Obstetrics and Gynecology

## 2017-10-01 VITALS — BP 137/91 | HR 73 | Ht 63.0 in | Wt 138.4 lb

## 2017-10-01 DIAGNOSIS — E663 Overweight: Secondary | ICD-10-CM

## 2017-10-01 DIAGNOSIS — Z6824 Body mass index (BMI) 24.0-24.9, adult: Secondary | ICD-10-CM

## 2017-10-01 MED ORDER — CYANOCOBALAMIN 1000 MCG/ML IJ SOLN
1000.0000 ug | Freq: Once | INTRAMUSCULAR | Status: AC
  Administered 2017-10-01: 1000 ug via INTRAMUSCULAR

## 2017-10-01 NOTE — Progress Notes (Signed)
Pt presents for wt, bp and b12. No s/e from med. NO change in wt.  Pt has not taken adipex in 1 week. Pt has been having a cough, sore throat, sinus drainage and fatigue. NO fevers noted. Pt wants some one to look at her throat. Examined pts throat no abnormalities noted. Advised pt to gargle in warm salt water, tylenol for pain, robutussin and mucinex. If fever develops see urgent care. Pt to f/u in 4 weeks.   Side note- when I brought pt back she asked if [facility] and [facility] were here. I advised her that they were off today. She states if she knew that she would have cancelled her appt. I asked her why? She states because they are who she likes. Offered to r/s her appt. She states she will be seen since she was already here. Pt seemed agitated from the start.

## 2017-10-05 ENCOUNTER — Other Ambulatory Visit: Payer: Self-pay | Admitting: *Deleted

## 2017-10-05 MED ORDER — AZITHROMYCIN 250 MG PO TABS: 250.0000 mg | ORAL_TABLET | Freq: Every day | ORAL | 0 refills | Status: DC

## 2017-10-10 ENCOUNTER — Other Ambulatory Visit: Payer: Self-pay | Admitting: Obstetrics and Gynecology

## 2017-10-10 DIAGNOSIS — Z1231 Encounter for screening mammogram for malignant neoplasm of breast: Secondary | ICD-10-CM

## 2017-10-30 ENCOUNTER — Ambulatory Visit (INDEPENDENT_AMBULATORY_CARE_PROVIDER_SITE_OTHER): Payer: Managed Care, Other (non HMO) | Admitting: Obstetrics and Gynecology

## 2017-10-30 ENCOUNTER — Encounter: Payer: Self-pay | Admitting: Obstetrics and Gynecology

## 2017-10-30 VITALS — BP 138/84 | HR 88 | Ht 63.0 in | Wt 133.4 lb

## 2017-10-30 DIAGNOSIS — Z6824 Body mass index (BMI) 24.0-24.9, adult: Secondary | ICD-10-CM | POA: Diagnosis not present

## 2017-10-30 MED ORDER — CYANOCOBALAMIN 1000 MCG/ML IJ SOLN
1000.0000 ug | Freq: Once | INTRAMUSCULAR | Status: AC
  Administered 2017-10-30: 1000 ug via INTRAMUSCULAR

## 2017-10-30 MED ORDER — PHENTERMINE HCL 37.5 MG PO TABS: 37.5000 mg | ORAL_TABLET | Freq: Every day | ORAL | 2 refills | Status: DC

## 2017-10-30 NOTE — Progress Notes (Signed)
Pt is here for wt, bp check, b-12 inj She is doing well,denies any side effects   10/30/17 wt- 133.4lb 10/01/17 wt- 138lb

## 2017-11-01 ENCOUNTER — Ambulatory Visit
Admission: RE | Admit: 2017-11-01 | Discharge: 2017-11-01 | Disposition: A | Discharge status: Home / Self Care | Payer: Managed Care, Other (non HMO) | Source: Ambulatory Visit | Attending: Obstetrics and Gynecology | Admitting: Obstetrics and Gynecology

## 2017-11-01 DIAGNOSIS — Z1231 Encounter for screening mammogram for malignant neoplasm of breast: Secondary | ICD-10-CM | POA: Diagnosis present

## 2018-03-14 ENCOUNTER — Other Ambulatory Visit: Payer: Self-pay | Admitting: *Deleted

## 2018-03-14 MED ORDER — XANAX 1 MG PO TABS: 1.0000 mg | ORAL_TABLET | Freq: Three times a day (TID) | ORAL | 2 refills | Status: DC | PRN

## 2018-04-07 ENCOUNTER — Other Ambulatory Visit: Payer: Self-pay | Admitting: Obstetrics and Gynecology

## 2018-06-20 ENCOUNTER — Other Ambulatory Visit: Payer: Self-pay | Admitting: *Deleted

## 2018-06-20 MED ORDER — XANAX 1 MG PO TABS: 1.0000 mg | ORAL_TABLET | Freq: Three times a day (TID) | ORAL | 2 refills | Status: DC | PRN

## 2018-09-06 ENCOUNTER — Other Ambulatory Visit: Payer: Self-pay | Admitting: Obstetrics and Gynecology

## 2018-09-06 MED ORDER — ALPRAZOLAM 1 MG PO TABS: 1.0000 mg | ORAL_TABLET | Freq: Three times a day (TID) | ORAL | 2 refills | Status: DC | PRN

## 2018-12-04 ENCOUNTER — Other Ambulatory Visit: Payer: Self-pay | Admitting: Obstetrics and Gynecology

## 2018-12-04 DIAGNOSIS — Z1231 Encounter for screening mammogram for malignant neoplasm of breast: Secondary | ICD-10-CM

## 2018-12-18 ENCOUNTER — Other Ambulatory Visit: Payer: Self-pay

## 2018-12-18 ENCOUNTER — Ambulatory Visit
Admission: RE | Admit: 2018-12-18 | Discharge: 2018-12-18 | Disposition: A | Discharge status: Home / Self Care | Payer: 59 | Source: Ambulatory Visit | Attending: Obstetrics and Gynecology | Admitting: Obstetrics and Gynecology

## 2018-12-18 DIAGNOSIS — Z1231 Encounter for screening mammogram for malignant neoplasm of breast: Secondary | ICD-10-CM | POA: Diagnosis present

## 2019-01-21 ENCOUNTER — Telehealth: Payer: Self-pay | Admitting: Surgical

## 2019-01-21 NOTE — Telephone Encounter (Signed)
Called patient due to getting a medication refill request for her Xanax 1 MG tab. I let the patient know that we would have to schedule her for an appointment with another provider due to Melody no longer being in the office and she has not been seen since September 2019.I explained that with [facility] policy she should be seen every three months for the prescription refill. Patient stated that this policy is ridiculous because she has been on this medication for years even with her previous provider. I explained that if she saw one of the providers they may not make her come in every three months, but it is a [facility] policy.  Patient stated that that was not happening. I told her [place] and phone was disconnected.

## 2019-02-11 ENCOUNTER — Other Ambulatory Visit: Payer: Self-pay

## 2019-02-11 ENCOUNTER — Encounter: Payer: Self-pay | Admitting: Obstetrics and Gynecology

## 2019-02-11 ENCOUNTER — Ambulatory Visit: Payer: Managed Care, Other (non HMO) | Admitting: Obstetrics and Gynecology

## 2019-02-11 VITALS — BP 128/85 | HR 68 | Ht 63.0 in | Wt 136.0 lb

## 2019-02-11 DIAGNOSIS — R6882 Decreased libido: Secondary | ICD-10-CM | POA: Diagnosis not present

## 2019-02-11 DIAGNOSIS — B001 Herpesviral vesicular dermatitis: Secondary | ICD-10-CM

## 2019-02-11 DIAGNOSIS — F419 Anxiety disorder, unspecified: Secondary | ICD-10-CM

## 2019-02-11 DIAGNOSIS — F329 Major depressive disorder, single episode, unspecified: Secondary | ICD-10-CM

## 2019-02-11 DIAGNOSIS — F32A Depression, unspecified: Secondary | ICD-10-CM

## 2019-02-11 DIAGNOSIS — Z79899 Other long term (current) drug therapy: Secondary | ICD-10-CM | POA: Diagnosis not present

## 2019-02-11 MED ORDER — VALACYCLOVIR HCL 1 G PO TABS: 1000.0000 mg | ORAL_TABLET | Freq: Every day | ORAL | 3 refills | Status: DC

## 2019-02-11 MED ORDER — ALPRAZOLAM 1 MG PO TABS: 1.0000 mg | ORAL_TABLET | Freq: Three times a day (TID) | ORAL | 1 refills | Status: DC | PRN

## 2019-02-11 MED ORDER — TRAZODONE HCL 100 MG PO TABS: 100.0000 mg | ORAL_TABLET | Freq: Every evening | ORAL | 3 refills | Status: DC | PRN

## 2019-02-11 MED ORDER — SERTRALINE HCL 100 MG PO TABS: 100.0000 mg | ORAL_TABLET | Freq: Two times a day (BID) | ORAL | 3 refills | Status: DC

## 2019-02-11 NOTE — Progress Notes (Signed)
Pt is present for medication refill. Pt stated that she is taking medication help with anxiety and depression. PHQ-9=6 GAD-7= 8.

## 2019-02-11 NOTE — Progress Notes (Signed)
GYNECOLOGY PROGRESS NOTE  Subjective:    Patient ID: Dawn Rivera, female    DOB: [birth year], 43 y.o.   MRN: [id number]  HPI  Patient is a 43 y.o. [phone] female who presents for medication review and for refills. She was previously a patient of [place] [facility], CNM.  Patient is currently on several medications, including Xanax, Zoloft, and Trazadone. She also takes Valtrex for h/o HSV (fever blisters), however note that she doesn't take it every day.  She is no longer taking the Phentermine as she is happy with her weight.   Patient reports that she has been on Zoloft for ~ 17 years for management of anxiety and depression.  Was initiated on it by her previous PCP Dr. Venia [facility]). Trazadone was initiated 10 years ago due to issues of racing thoughts and difficulty sleeping (noted concerns for starting on a sedative such as Ambien and also wanted to help her libido).  Lastly, she was initiated on Xanax several years ago, to further help with anxiety, and also helps her to sleep.  Notes that although prescribed for 3 times daily, usually uses one every night or every other night as it also helps her to sleep. She does report that she has had the occasional panic attack (moreso during this year due to stress and anxiety in the COVID pandemic).  Screening performed today, PHQ-9 score is 5, GAD-7 score is 8.   She reports that she is happy that she finally has a regimen that works for her.  Is very "even keeled" finally. She notes her daughter also suffers from depression and anxiety. Was referred by our practice to a Psychiatrist, is doing well with therapy and change in medications.    The following portions of the patient's history were reviewed and updated as appropriate: allergies, current medications, past family history, past medical history, past social history, past surgical history and problem list.  Review of Systems A comprehensive review of systems was  negative except for: Genitourinary: positive for decreased libido, however patient notes it is not all that bothersome to her at this time   Objective:   Blood pressure 128/85, pulse 68, height 5' 3"  (1.6 m), weight 136 lb (61.7 kg), last menstrual period 04/16/2015. General appearance: alert and no distress Psychologic: Normal affect, normal mood Neurologic: Grossly normal   Assessment:   Medication review History of anxiety and depression Decreased libido Fever blisters  Plan:   1.  Medication review - patient desires refill on several medications. Currently stable on medication regimen. Discussed the importance of medication review in order to continuously assess the need for continuation. Also discussed risks of dependence on certain types of medications.  2. History of anxiety and depression - currently on Trazadone, Zoloft, and Xanax.  Desires refills, currently stable without symptoms.  Screenings performed today.  Advised on judicious and responsible use of all medications. Will refill meds.  3. Decreased libido, patient noted that she desired to mention this, however currently is not significantly bothered by her symptoms (but notes that her husband would probably like for more intimate encounters.  Can discuss management options at a future visit if she desires.  4. H/o fever blisters, none recently. Desires refill on her Valtrex. Will prescribe.   Patient to f/u in 2-3 months for annual exam as she is overdue.    A total of 25 minutes were spent face-to-face with the patient during this encounter and over half of that time involved  counseling and coordination of care.    Rubie Maid, MD [facility]

## 2019-02-11 NOTE — Patient Instructions (Signed)
Basics of Medicine Management Taking your medicines correctly is an important part of managing or preventing medical problems. Make sure you know what disease or condition your medicine is treating, and how and when to take it. If you do not take your medicine correctly, it may not work well and may cause unpleasant side effects, including serious health problems. What should I do when I am taking medicines?   Read all the labels and inserts that come with your medicines. Review the information often.  Talk with your pharmacist if you get a refill and notice a change in the size, color, or shape of your medicines.  Know the potential side effects for each medicine that you take.  Try to get all your medicines from the same pharmacy. The pharmacist will have all your information and will understand how your medicines will affect each other (interact).  Tell your health care provider about all your medicines, including over-the-counter medicines, vitamins, and herbal or dietary supplements. He or she will make sure that nothing will interact with any of your prescribed medicines. How can I take my medicines safely?  Take medicines only as told by your health care provider. ? Do not take more of your medicine than instructed. ? Do not take anyone else's medicines. ? Do not share your medicines with others. ? Do not stop taking your medicines unless your health care provider tells you to do so. ? You may need to avoid alcohol or certain foods or liquids when taking certain medicines. Follow your health care provider's instructions.  Do not split, mash, or chew your medicines unless your health care provider tells you to do so. Tell your health care provider if you have trouble swallowing your medicines.  For liquid medicine, use the dosing container that was provided. How should I organize my medicines?  Know your medicines  Know what each of your medicines looks like. This includes size,  color, and shape. Tell your health care provider if you are having trouble recognizing all the medicines that you are taking.  If you cannot tell your medicines apart because they look similar, keep them in original bottles.  If you cannot read the labels on the bottles, tell your pharmacist to put your medicines in containers with large print.  Review your medicines and your schedule with family members, a friend, or a caregiver. Use a pill organizer  Use a tool to organize your medicine schedule. Tools include a weekly pillbox, a written chart, a notebook, or a calendar.  Your tool should help you remember the following things about each medicine: ? The name of the medicine. ? The amount (dose) to take. ? The schedule. This is the day and time the medicine should be taken. ? The appearance. This includes color, shape, size, and stamp. ? How to take your medicines. This includes instructions to take them with food, without food, with fluids, or with other medicines.  Create reminders for taking your medicines. Use sticky notes, or alarms on your watch, mobile device, or phone calendar.  You may choose to use a more advanced management system. These systems have storage, alarms, and visual and audio prompts.  Some medicines can be taken on an "as-needed" basis. These include medicines for nausea or pain. If you take an as-needed medicine, write down the name and dose, as well as the date and time that you took it. How should I plan for travel?  Take your pillbox, medicines, and organization system with  you when traveling.  Have your medicines refilled before you travel. This will ensure that you do not run out of your medicines while you are away from home.  Always carry an updated list of your medicines with you. If there is an emergency, a first responder can quickly see what medicines you are taking.  Do not pack your medicines in checked luggage in case your luggage is lost or  delayed.  If any of your medicines is considered a controlled substance, make sure you bring a letter from your health care provider with you. How should I store and discard my medicines? For safe storage:  Store medicines in a cool, dry area away from light, or as directed by your health care provider. Do not store medicines in the bathroom. Heat and humidity will affect them.  Do not store your medicines with other chemicals, or with medicines for pets or other household members.  Keep medicines away from children and pets. Do not leave them on counters or bedside tables. Store them in high cabinets or on high shelves. For safe disposal:  Check expiration dates regularly. Do not take expired medicines. Discard medicines that are older than the expiration date.  Learn a safe way to dispose of your medicines. You may: ? Use a local government, hospital, or pharmacy medicine-take-back program. ? Mix the medicines with inedible substances, put them in a sealed bag or empty container, and throw them in the trash. What should I remember?  Tell your health care provider if you: ? Experience side effects. ? Have new symptoms. ? Have other concerns about taking your medicines.  Review your medicines regularly with your health care provider. Other medicines, diet, medical conditions, weight changes, and daily habits can all affect how medicines work. Ask if you need to continue taking each medicine, and discuss how well each one is working.  Refill your medicines early to avoid running out of them.  In case of an accidental overdose, call your local [place] at [phone] or visit your local emergency department immediately. This is important. Summary  Taking your medicines correctly is an important part of managing or preventing medical problems.  You need to make sure that you understand what you are taking a medicine for, as well as how and when you need to take  it.  Know your medicines and use a pill organizer to help you take your medicines correctly.  In case of an accidental overdose, call your local [place] at [phone] or visit your local emergency department immediately. This is important. This information is not intended to replace advice given to you by your health care provider. Make sure you discuss any questions you have with your health care provider. Document Released: 05/31/2010 Document Revised: 02/08/2017 Document Reviewed: 02/08/2017 Elsevier Patient Education  2020 [employer].

## 2019-02-12 ENCOUNTER — Encounter: Payer: Self-pay | Admitting: Obstetrics and Gynecology

## 2019-03-04 IMAGING — MG MM  DIGITAL SCREENING BREAST BILAT IMPLANT W/ TOMO W/ CAD
9 of 17 series · 9 of 33 positions shown · non-contrast
Comparison: Previous exam(s).

CLINICAL DATA: Screening.

EXAM:
2D DIGITAL SCREENING BILATERAL MAMMOGRAM WITH IMPLANTS, CAD AND
ADJUNCT TOMO
The patient has retropectoral implants. Standard and implant
displaced views were performed.

[R MLO (1 of 2)]
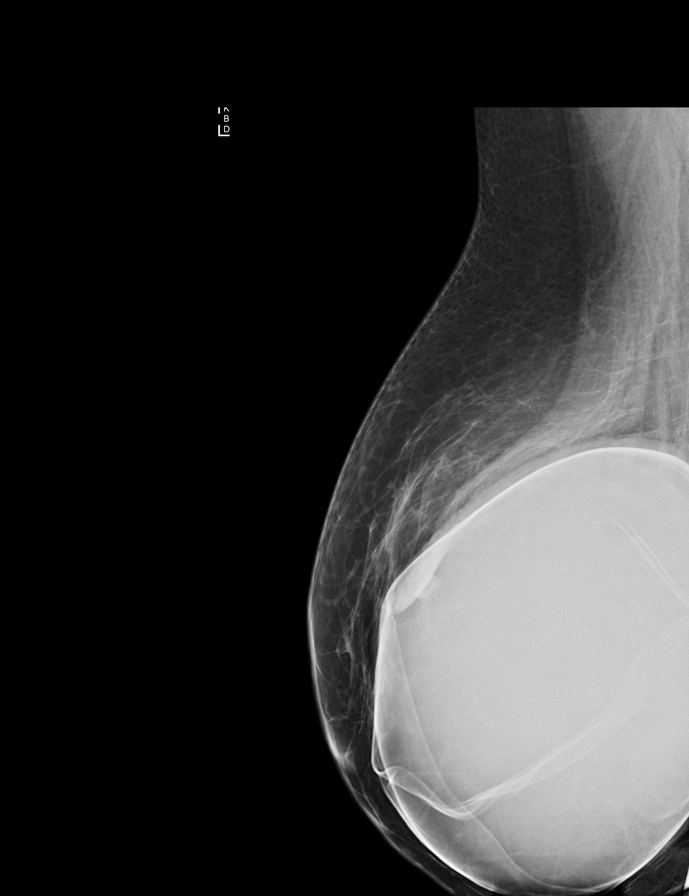

[L CC (1 of 2)]
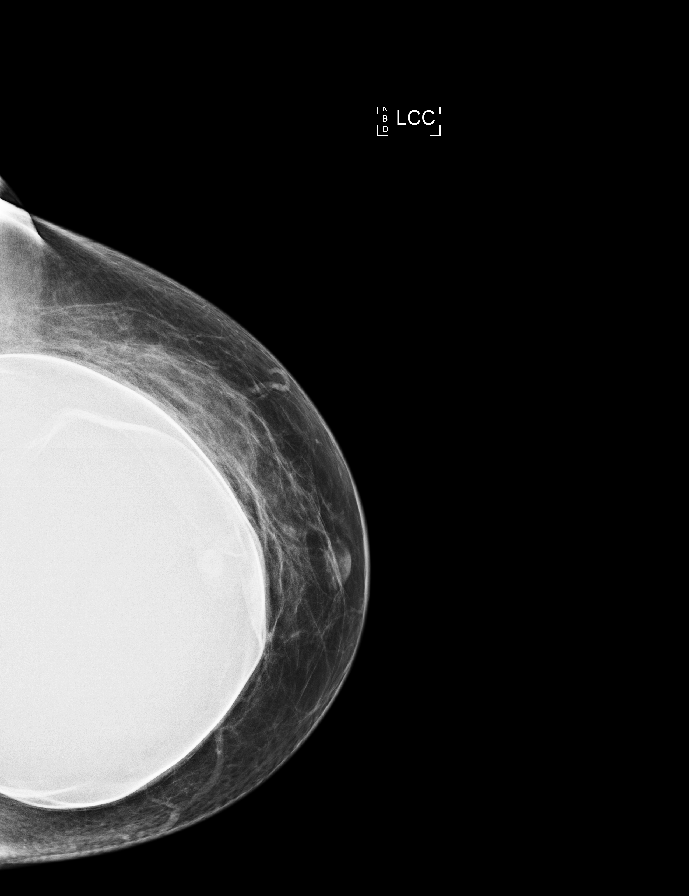

[L MLO (1 of 2)]
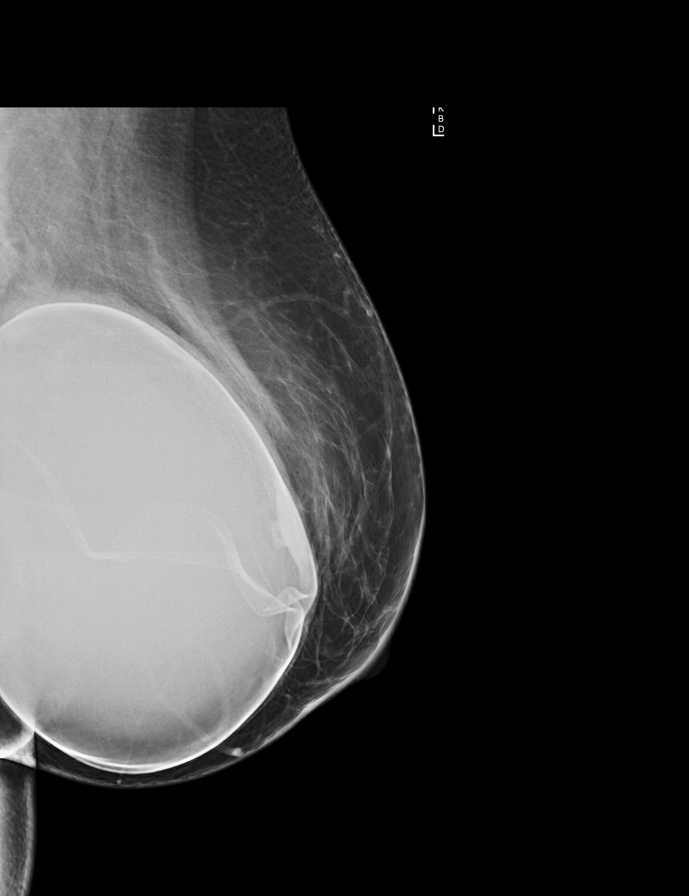

[R MLO (2 of 2)]
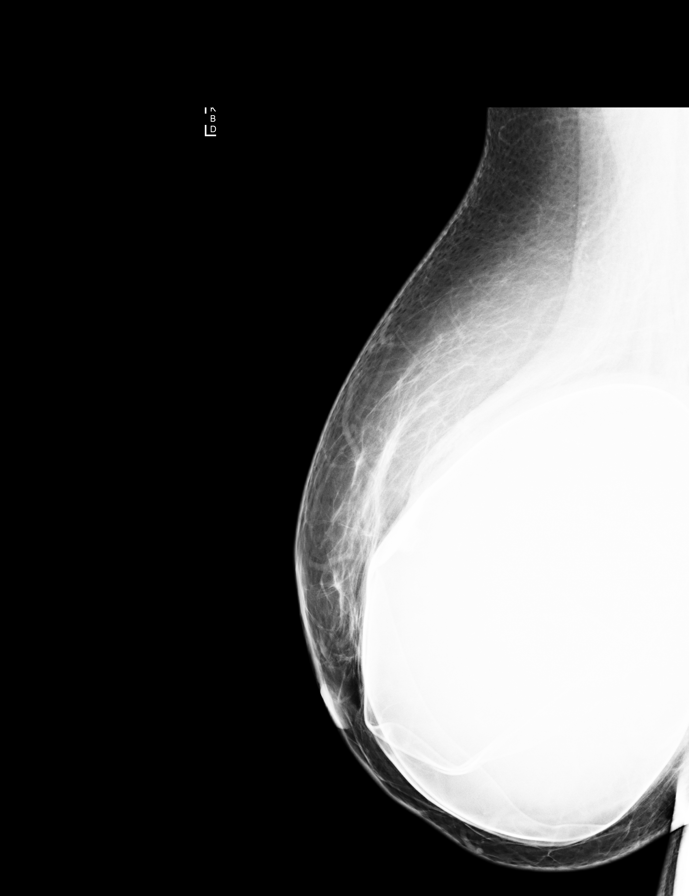

[R CC (1 of 2)]
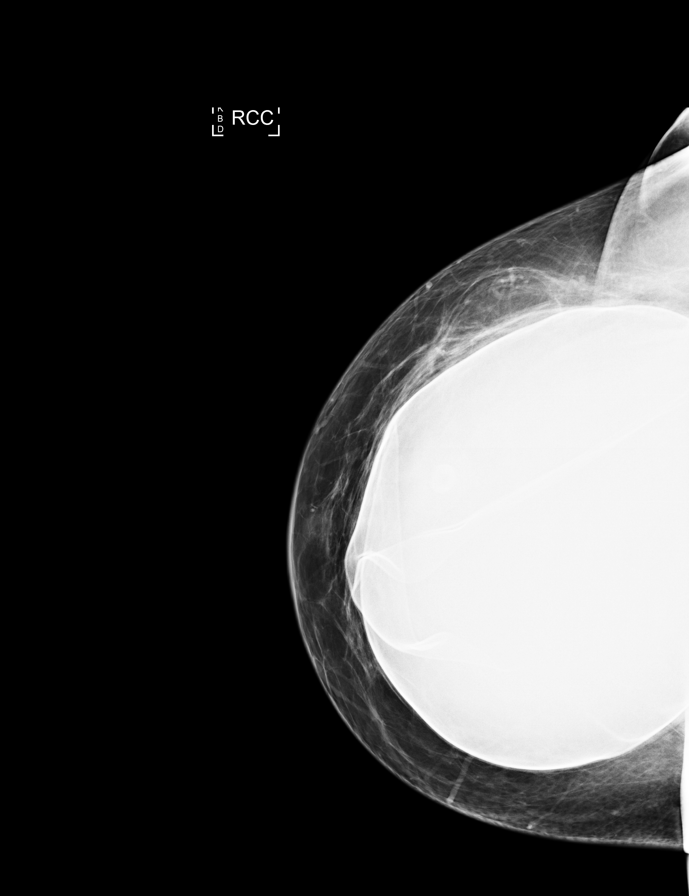

[L MLO (2 of 2)]
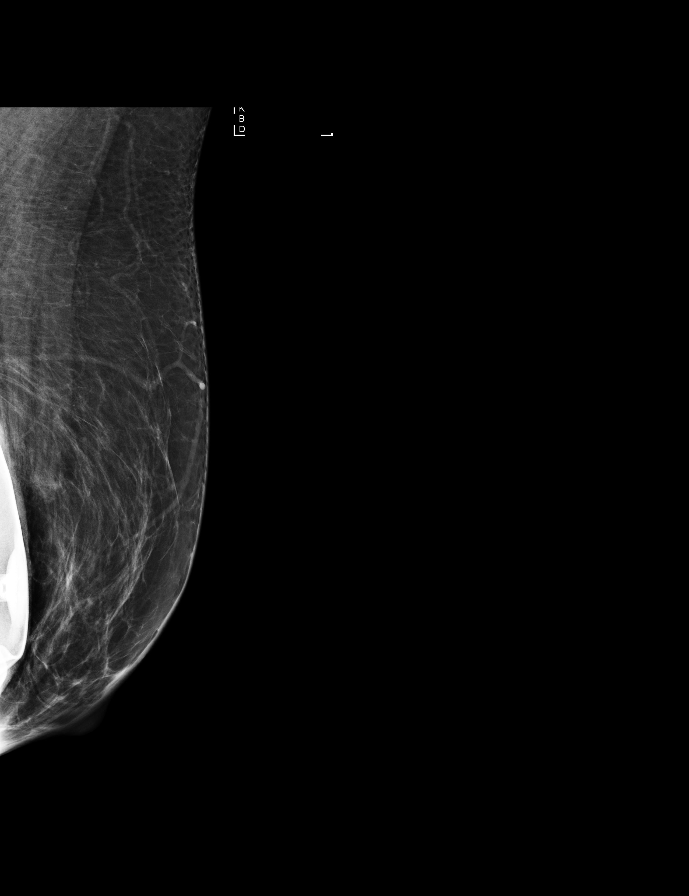

[R CC (2 of 2)]
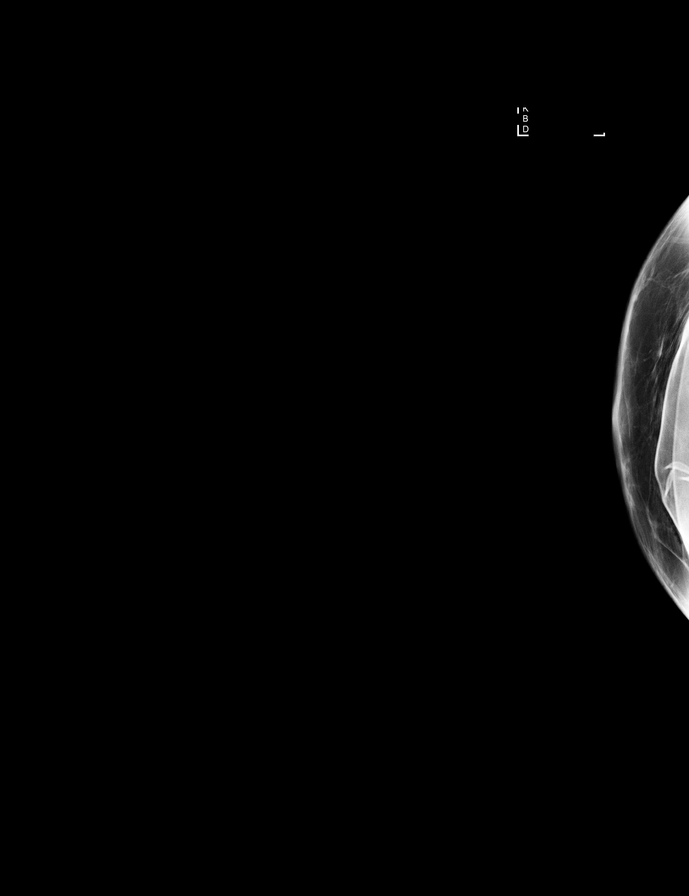

[L CC (2 of 2)]
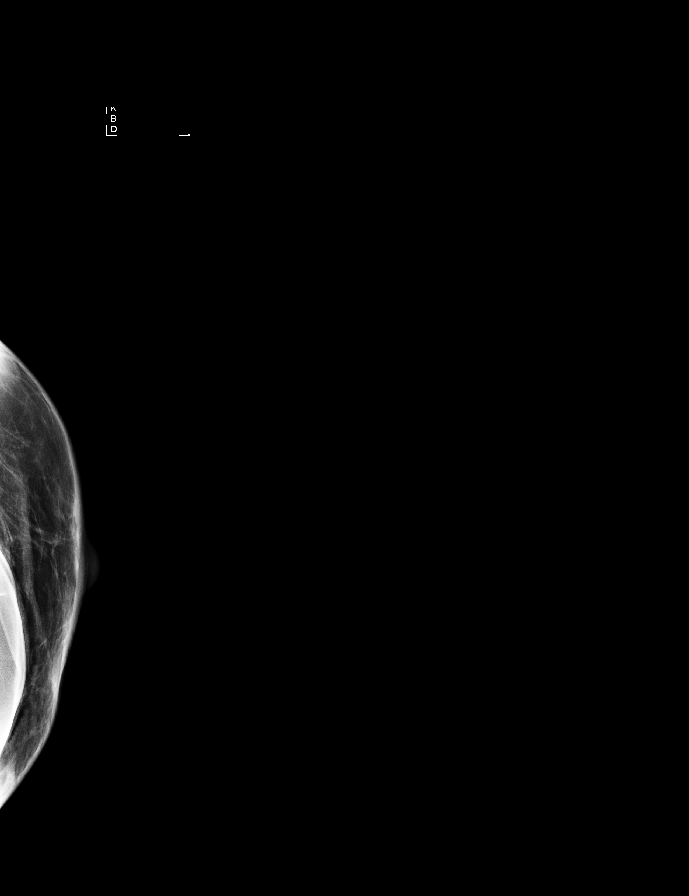

[L CC synth-2D]
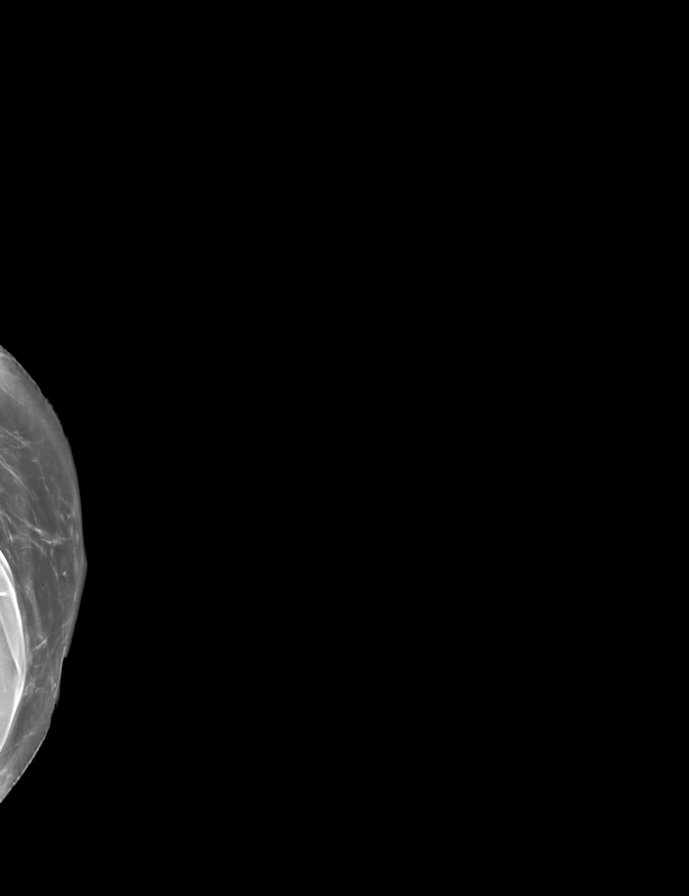

[9 of 33 positions shown; findings below may reference images not displayed]

ACR Breast Density Category b: There are scattered areas of
fibroglandular density.
FINDINGS: There are no findings suspicious for malignancy. Images were
processed with CAD.
IMPRESSION: No mammographic evidence of malignancy. A result letter of this
screening mammogram will be mailed directly to the patient.

RECOMMENDATION:
Screening mammogram in one year. (Code:[id number])

BI-RADS CATEGORY  1:  Negative.

## 2019-05-02 ENCOUNTER — Other Ambulatory Visit: Payer: Self-pay

## 2019-05-02 MED ORDER — ALPRAZOLAM 1 MG PO TABS: 1.0000 mg | ORAL_TABLET | Freq: Three times a day (TID) | ORAL | 1 refills | Status: DC | PRN

## 2019-05-02 NOTE — Telephone Encounter (Signed)
mychart message sent to patient. Attempted to resend script but will not allow due to it being controlled substance and it needs a thumb print.

## 2019-05-27 NOTE — Progress Notes (Signed)
Pt present for annual exam. Pt had hysterectomy LMP dated 04/16/2015. Mammogram completed 12/18/18. ordered placed. Pt stated that  she was well denies any issues at this time. PHQ-9=1. GAD-7=4.

## 2019-05-28 ENCOUNTER — Other Ambulatory Visit: Payer: Self-pay

## 2019-05-28 ENCOUNTER — Ambulatory Visit (INDEPENDENT_AMBULATORY_CARE_PROVIDER_SITE_OTHER): Payer: 59 | Admitting: Obstetrics and Gynecology

## 2019-05-28 ENCOUNTER — Encounter: Payer: Self-pay | Admitting: Obstetrics and Gynecology

## 2019-05-28 VITALS — BP 106/72 | HR 69 | Ht 63.0 in | Wt 137.8 lb

## 2019-05-28 DIAGNOSIS — F329 Major depressive disorder, single episode, unspecified: Secondary | ICD-10-CM

## 2019-05-28 DIAGNOSIS — Z01419 Encounter for gynecological examination (general) (routine) without abnormal findings: Secondary | ICD-10-CM

## 2019-05-28 DIAGNOSIS — F32A Depression, unspecified: Secondary | ICD-10-CM

## 2019-05-28 DIAGNOSIS — Z1231 Encounter for screening mammogram for malignant neoplasm of breast: Secondary | ICD-10-CM | POA: Diagnosis not present

## 2019-05-28 DIAGNOSIS — F419 Anxiety disorder, unspecified: Secondary | ICD-10-CM

## 2019-05-28 DIAGNOSIS — Z13220 Encounter for screening for lipoid disorders: Secondary | ICD-10-CM

## 2019-05-28 NOTE — Patient Instructions (Addendum)
Preventive Care 40-46 Years Old, Female Preventive care refers to visits with your health care provider and lifestyle choices that can promote health and wellness. This includes:  A yearly physical exam. This may also be called an annual well check.  Regular dental visits and eye exams.  Immunizations.  Screening for certain conditions.  Healthy lifestyle choices, such as eating a healthy diet, getting regular exercise, not using drugs or products that contain nicotine and tobacco, and limiting alcohol use. What can I expect for my preventive care visit? Physical exam Your health care provider will check your:  Height and weight. This may be used to calculate body mass index (BMI), which tells if you are at a healthy weight.  Heart rate and blood pressure.  Skin for abnormal spots. Counseling Your health care provider may ask you questions about your:  Alcohol, tobacco, and drug use.  Emotional well-being.  Home and relationship well-being.  Sexual activity.  Eating habits.  Work and work environment.  Method of birth control.  Menstrual cycle.  Pregnancy history. What immunizations do I need?  Influenza (flu) vaccine  This is recommended every year. Tetanus, diphtheria, and pertussis (Tdap) vaccine  You may need a Td booster every 10 years. Varicella (chickenpox) vaccine  You may need this if you have not been vaccinated. Zoster (shingles) vaccine  You may need this after age 60. Measles, mumps, and rubella (MMR) vaccine  You may need at least one dose of MMR if you were born in 1957 or later. You may also need a second dose. Pneumococcal conjugate (PCV13) vaccine  You may need this if you have certain conditions and were not previously vaccinated. Pneumococcal polysaccharide (PPSV23) vaccine  You may need one or two doses if you smoke cigarettes or if you have certain conditions. Meningococcal conjugate (MenACWY) vaccine  You may need this if you  have certain conditions. Hepatitis A vaccine  You may need this if you have certain conditions or if you travel or work in places where you may be exposed to hepatitis A. Hepatitis B vaccine  You may need this if you have certain conditions or if you travel or work in places where you may be exposed to hepatitis B. Haemophilus influenzae type b (Hib) vaccine  You may need this if you have certain conditions. Human papillomavirus (HPV) vaccine  If recommended by your health care provider, you may need three doses over 6 months. You may receive vaccines as individual doses or as more than one vaccine together in one shot (combination vaccines). Talk with your health care provider about the risks and benefits of combination vaccines. What tests do I need? Blood tests  Lipid and cholesterol levels. These may be checked every 5 years, or more frequently if you are over 50 years old.  Hepatitis C test.  Hepatitis B test. Screening  Lung cancer screening. You may have this screening every year starting at age 55 if you have a 30-pack-year history of smoking and currently smoke or have quit within the past 15 years.  Colorectal cancer screening. All adults should have this screening starting at age 50 and continuing until age 75. Your health care provider may recommend screening at age 45 if you are at increased risk. You will have tests every 1-10 years, depending on your results and the type of screening test.  Diabetes screening. This is done by checking your blood sugar (glucose) after you have not eaten for a while (fasting). You may have this   done every 1-3 years.  Mammogram. This may be done every 1-2 years. Talk with your health care provider about when you should start having regular mammograms. This may depend on whether you have a family history of breast cancer.  BRCA-related cancer screening. This may be done if you have a family history of breast, ovarian, tubal, or peritoneal  cancers.  Pelvic exam and Pap test. This may be done every 3 years starting at age 21. Starting at age 48, this may be done every 5 years if you have a Pap test in combination with an HPV test. Other tests  Sexually transmitted disease (STD) testing.  Bone density scan. This is done to screen for osteoporosis. You may have this scan if you are at high risk for osteoporosis. Follow these instructions at home: Eating and drinking  Eat a diet that includes fresh fruits and vegetables, whole grains, lean protein, and low-fat dairy.  Take vitamin and mineral supplements as recommended by your health care provider.  Do not drink alcohol if: ? Your health care provider tells you not to drink. ? You are pregnant, may be pregnant, or are planning to become pregnant.  If you drink alcohol: ? Limit how much you have to 0-1 drink a day. ? Be aware of how much alcohol is in your drink. In the [place] one drink equals one 12 oz bottle of beer (355 mL), one 5 oz glass of wine (148 mL), or one 1 oz glass of hard liquor (44 mL). Lifestyle  Take daily care of your teeth and gums.  Stay active. Exercise for at least 30 minutes on 5 or more days each week.  Do not use any products that contain nicotine or tobacco, such as cigarettes, e-cigarettes, and chewing tobacco. If you need help quitting, ask your health care provider.  If you are sexually active, practice safe sex. Use a condom or other form of birth control (contraception) in order to prevent pregnancy and STIs (sexually transmitted infections).  If told by your health care provider, take low-dose aspirin daily starting at age 29. What's next?  Visit your health care provider once a year for a well check visit.  Ask your health care provider how often you should have your eyes and teeth checked.  Stay up to date on all vaccines. This information is not intended to replace advice given to you by your health care provider. Make sure you  discuss any questions you have with your health care provider. Document Revised: 10/25/2017 Document Reviewed: 10/25/2017 Elsevier Patient Education  2020 [employer] Breast self-awareness is knowing how your breasts look and feel. Doing breast self-awareness is important. It allows you to catch a breast problem early while it is still small and can be treated. All women should do breast self-awareness, including women who have had breast implants. Tell your doctor if you notice a change in your breasts. What you need:  A mirror.  A well-lit room. How to do a breast self-exam A breast self-exam is one way to learn what is normal for your breasts and to check for changes. To do a breast self-exam: Look for changes  1. Take off all the clothes above your waist. 2. Stand in front of a mirror in a room with good lighting. 3. Put your hands on your hips. 4. Push your hands down. 5. Look at your breasts and nipples in the mirror to see if one breast or nipple looks different from  the other. Check to see if: ? The shape of one breast is different. ? The size of one breast is different. ? There are wrinkles, dips, and bumps in one breast and not the other. 6. Look at each breast for changes in the skin, such as: ? Redness. ? Scaly areas. 7. Look for changes in your nipples, such as: ? Liquid around the nipples. ? Bleeding. ? Dimpling. ? Redness. ? A change in where the nipples are. Feel for changes  1. Lie on your back on the floor. 2. Feel each breast. To do this, follow these steps: ? Pick a breast to feel. ? Put the arm closest to that breast above your head. ? Use your other arm to feel the nipple area of your breast. Feel the area with the pads of your three middle fingers by making small circles with your fingers. For the first circle, press lightly. For the second circle, press harder. For the third circle, press even harder. ? Keep making circles with  your fingers at the different pressures as you move down your breast. Stop when you feel your ribs. ? Move your fingers a little toward the center of your body. ? Start making circles with your fingers again, this time going up until you reach your collarbone. ? Keep making up-and-down circles until you reach your armpit. Remember to keep using the three pressures. ? Feel the other breast in the same way. 3. Sit or stand in the tub or shower. 4. With soapy water on your skin, feel each breast the same way you did in step 2 when you were lying on the floor. Write down what you find Writing down what you find can help you remember what to tell your doctor. Write down:  What is normal for each breast.  Any changes you find in each breast, including: ? The kind of changes you find. ? Whether you have pain. ? Size and location of any lumps.  When you last had your menstrual period. General tips  Check your breasts every month.  If you are breastfeeding, the best time to check your breasts is after you feed your baby or after you use a breast pump.  If you get menstrual periods, the best time to check your breasts is 5-7 days after your menstrual period is over.  With time, you will become comfortable with the self-exam, and you will begin to know if there are changes in your breasts. Contact a doctor if you:  See a change in the shape or size of your breasts or nipples.  See a change in the skin of your breast or nipples, such as red or scaly skin.  Have fluid coming from your nipples that is not normal.  Find a lump or thick area that was not there before.  Have pain in your breasts.  Have any concerns about your breast health. Summary  Breast self-awareness includes looking for changes in your breasts, as well as feeling for changes within your breasts.  Breast self-awareness should be done in front of a mirror in a well-lit room.  You should check your breasts every month.  If you get menstrual periods, the best time to check your breasts is 5-7 days after your menstrual period is over.  Let your doctor know of any changes you see in your breasts, including changes in size, changes on the skin, pain or tenderness, or fluid from your nipples that is not normal. This information is  not intended to replace advice given to you by your health care provider. Make sure you discuss any questions you have with your health care provider. Document Revised: 10/02/2017 Document Reviewed: 10/02/2017 Elsevier Patient Education  [employer].

## 2019-05-28 NOTE — Progress Notes (Signed)
GYNECOLOGY ANNUAL PHYSICAL EXAM PROGRESS NOTE  Subjective:    Dawn Rivera is a 44 y.o. [phone] female who presents for an annual exam.  The patient is sexually active. The patient wears seatbelts: yes. The patient participates in regular exercise: yes (walking). Has the patient ever been transfused or tattooed?: yes (tattoos).   The patient has the following complaints today: None  Gynecologic History Patient's last menstrual period was 04/16/2015. Contraception: status post hysterectomy History of STI's: History of HPV Last Pap: 03/07/2016 (vaginal pap). Results were: normal.  Notes h/o abnormal pap smears, h/o LEEP. Last mammogram: 12/18/2018. Results were: normal   OB History  Gravida Para Term Preterm AB Living  4 3 3  0 1 3  SAB TAB Ectopic Multiple Live Births  1 0 0 0 3    # Outcome Date GA Lbr Len/2nd Weight Sex Delivery Anes PTL Lv  4 Term 2003   7 lb 4.8 oz (3.311 kg) M Vag-Spont   LIV  3 SAB 2002          2 Term 1999   7 lb 8 oz (3.402 kg) F Vag-Spont   LIV  1 Term 1995   6 lb 4.8 oz (2.858 kg) M Vag-Spont   LIV    Past Medical History:  Diagnosis Date  . Anxiety   . Cancer (HCC)    cervical  . Depression   . HPV (human papilloma virus) anogenital infection   . Vaginal Pap smear, abnormal     Past Surgical History:  Procedure Laterality Date  . AUGMENTATION MAMMAPLASTY Bilateral 2003   breast implants  . CYSTOSCOPY  05/24/2015   Procedure: CYSTOSCOPY;  Surgeon: Brayton Mars, MD;  Location: [facility];  Service: Gynecology;;   Procedure: LAPAROSCOPIC ASSISTED VAGINAL HYSTERECTOMY WITH BILATERAL SALPINGECTOMY;  Surgeon: Brayton Mars, MD;  Location: [facility];  Service: Gynecology;  Laterality: Bilateral;  . LEEP N/A 04/12/2015   Procedure: LOOP ELECTROSURGICAL EXCISION PROCEDURE (LEEP);  Surgeon: Brayton Mars, MD;  Location: [facility];  Service: Gynecology;  Laterality: N/A;    Family History  Problem Relation Age of Onset  .  Breast cancer Maternal Aunt   . Heart disease Mother   . Colon cancer Maternal Grandmother   . Ovarian cancer Maternal Grandfather   . Diabetes Neg Hx     Social History   Socioeconomic History  . Marital status: Married    Spouse name: Not on file  . Number of children: Not on file  . Years of education: Not on file  . Highest education level: Not on file  Occupational History  . Not on file  Tobacco Use  . Smoking status: Never Smoker  . Smokeless tobacco: Never Used  Substance and Sexual Activity  . Alcohol use: Yes    Comment: occas  . Drug use: No  . Sexual activity: Yes    Birth control/protection: Surgical  Other Topics Concern  . Not on file  Social History Narrative  . Not on file   Social Determinants of Health   Financial Resource Strain:   . Difficulty of Paying Living Expenses:   Food Insecurity:   . Worried About Charity fundraiser in the Last Year:   . Arboriculturist in the Last Year:   Transportation Needs:   . Film/video editor (Medical):   Marland Kitchen Lack of Transportation (Non-Medical):   Physical Activity:   . Days of Exercise per Week:   . Minutes of  Exercise per Session:   Stress:   . Feeling of Stress :   Social Connections:   . Frequency of Communication with Friends and Family:   . Frequency of Social Gatherings with Friends and Family:   . Attends Religious Services:   . Active Member of Clubs or Organizations:   . Attends Archivist Meetings:   Marland Kitchen Marital Status:   Intimate Partner Violence:   . Fear of Current or Ex-Partner:   . Emotionally Abused:   Marland Kitchen Physically Abused:   . Sexually Abused:     Current Outpatient Medications on File Prior to Visit  Medication Sig Dispense Refill  . Ascorbic Acid (VITAMIN C) 100 MG tablet Take 100 mg by mouth daily. Reported on 09/02/2015    . azithromycin (ZITHROMAX) 250 MG tablet Take 1 tablet (250 mg total) by mouth daily. Pt to take 2 tablets 1st day, then 1 daily (Patient not taking:  Reported on 10/30/2017) 6 tablet 0  . cyanocobalamin (,VITAMIN B-12,) 1000 MCG/ML injection Inject 1 mL (1,000 mcg total) into the muscle every 30 (thirty) days. (Patient not taking: Reported on 02/11/2019) 10 mL 1  . IBU 800 MG tablet TAKE 1 TABLET THREE TIMES A DAY 50 tablet 2  . Multiple Vitamin (MULTI-VITAMIN DAILY PO) Take by mouth.    . phentermine (ADIPEX-P) 37.5 MG tablet Take 1 tablet (37.5 mg total) by mouth daily before breakfast. (Patient not taking: Reported on 02/11/2019) 30 tablet 2  . sertraline (ZOLOFT) 100 MG tablet Take 1 tablet (100 mg total) by mouth 2 (two) times daily. 180 tablet 3  . traZODone (DESYREL) 100 MG tablet Take 1 tablet (100 mg total) by mouth at bedtime as needed. 90 tablet 3  . valACYclovir (VALTREX) 1000 MG tablet Take 1 tablet (1,000 mg total) by mouth daily. 90 tablet 3   Current Facility-Administered Medications on File Prior to Visit  Medication Dose Route Frequency Provider Last Rate Last Admin  . cyanocobalamin ((VITAMIN B-12)) injection 1,000 mcg  1,000 mcg Intramuscular Once Philip Aspen, CNM        No Known Allergies    Review of Systems Constitutional: negative for chills, fatigue, fevers and sweats Eyes: negative for irritation, redness and visual disturbance Ears, nose, mouth, throat, and face: negative for hearing loss, nasal congestion, snoring and tinnitus Respiratory: negative for asthma, cough, sputum Cardiovascular: negative for chest pain, dyspnea, exertional chest pressure/discomfort, irregular heart beat, palpitations and syncope Gastrointestinal: negative for abdominal pain, change in bowel habits, nausea and vomiting Genitourinary: negative for abnormal menstrual periods, genital lesions, sexual problems and vaginal discharge, dysuria and urinary incontinence Integument/breast: negative for breast lump, breast tenderness and nipple discharge Hematologic/lymphatic: negative for bleeding and easy bruising Musculoskeletal:negative  for back pain and muscle weakness Neurological: negative for dizziness, headaches, vertigo and weakness Endocrine: negative for diabetic symptoms including polydipsia, polyuria and skin dryness Allergic/Immunologic: negative for hay fever and urticaria      Objective:  Blood pressure 106/72, pulse 69, height 5' 3"  (1.6 m), weight 137 lb 12.8 oz (62.5 kg), last menstrual period 04/16/2015. Body mass index is 24.41 kg/m.  General Appearance:    Alert, cooperative, no distress, appears stated age  Head:    Normocephalic, without obvious abnormality, atraumatic  Eyes:    PERRL, conjunctiva/corneas clear, EOM's intact, both eyes  Ears:    Normal external ear canals, both ears  Nose:   Nares normal, septum midline, mucosa normal, no drainage or sinus tenderness  Throat:   Lips, mucosa,  and tongue normal; teeth and gums normal  Neck:   Supple, symmetrical, trachea midline, no adenopathy; thyroid: no enlargement/tenderness/nodules; no carotid bruit or JVD  Back:     Symmetric, no curvature, ROM normal, no CVA tenderness  Lungs:     Clear to auscultation bilaterally, respirations unlabored  Chest Wall:    No tenderness or deformity   Heart:    Regular rate and rhythm, S1 and S2 normal, no murmur, rub or gallop  Breast Exam:    No tenderness, masses, or nipple abnormality. Breast implants palpated bilaterally.   Abdomen:     Soft, non-tender, bowel sounds active all four quadrants, no masses, no organomegaly.    Genitalia:    Pelvic:external genitalia normal, vagina without lesions, discharge, or tenderness, rectovaginal septum  normal. Cervix normal in appearance, no cervical motion tenderness, no adnexal masses or tenderness.  Uterus normal size, shape, mobile, regular contours, nontender.  Rectal:    Normal external sphincter.  No hemorrhoids appreciated. Internal exam not done.   Extremities:   Extremities normal, atraumatic, no cyanosis or edema  Pulses:   2+ and symmetric all extremities   Skin:   Skin color, texture, turgor normal, no rashes or lesions  Lymph nodes:   Cervical, supraclavicular, and axillary nodes normal  Neurologic:   CNII-XII intact, normal strength, sensation and reflexes throughout   .  Labs:  Lab Results  Component Value Date   WBC 7.6 05/19/2015   HGB 11.5 (L) 05/25/2015   HCT 41.9 05/19/2015   MCV 87.5 05/19/2015   PLT 309 05/19/2015    Lab Results  Component Value Date   CREATININE 1.05 (H) 07/11/2017   BUN 11 07/11/2017   NA 140 07/11/2017   K 4.3 07/11/2017   CL 100 07/11/2017   CO2 24 07/11/2017    Lab Results  Component Value Date   ALT 15 07/11/2017   AST 16 07/11/2017   ALKPHOS 84 07/11/2017   BILITOT 0.3 07/11/2017    Lab Results  Component Value Date   TSH 3.620 07/11/2017     Assessment:   1. Encounter for well woman exam with routine gynecological exam   2. Breast cancer screening by mammogram   3. Screening for lipid disorders   4. Anxiety and depression     Plan:    Blood tests: CBC with diff, Comprehensive metabolic panel, Lipoproteins and TSH. Breast self exam technique reviewed and patient encouraged to perform self-exam monthly. Contraception: status post hysterectomy. Discussed healthy lifestyle modifications. Mammogram up to date.  Due in October. Order placed.  Pap smear no longer needed. Patient is s/p hysterectomy with no history of severe cervical dysplasia.  Anxiety and depression well managed on meds.  PHQ-9=1. GAD-7=4.  Discussed COVID-19 vaccination, all questions answered.  Follow up in 1 year for annual exam.    Rubie Maid, MD [facility]

## 2019-05-29 LAB — LIPID PANEL
Chol/HDL Ratio: 3.6 ratio (ref 0.0–4.4)
Cholesterol, Total: 199 mg/dL (ref 100–199)
HDL: 56 mg/dL (ref >39)
LDL Chol Calc (NIH): 116 mg/dL — ABNORMAL HIGH (ref 0–99)
Triglycerides: 153 mg/dL — ABNORMAL HIGH (ref 0–149)
VLDL Cholesterol Cal: 27 mg/dL (ref 5–40)

## 2019-05-29 LAB — CBC
Hematocrit: 42.4 % (ref 34.0–46.6)
Hemoglobin: 14.1 g/dL (ref 11.1–15.9)
MCH: 29.8 pg (ref 26.6–33.0)
MCHC: 33.3 g/dL (ref 31.5–35.7)
MCV: 90 fL (ref 79–97)
Platelets: 304 10*3/uL (ref 150–450)
RBC: 4.73 x10E6/uL (ref 3.77–5.28)
RDW: 13.1 % (ref 11.7–15.4)
WBC: 5.8 10*3/uL (ref 3.4–10.8)

## 2019-05-29 LAB — COMPREHENSIVE METABOLIC PANEL
ALT: 16 IU/L (ref 0–32)
AST: 17 IU/L (ref 0–40)
Albumin/Globulin Ratio: 1.8 (ref 1.2–2.2)
Albumin: 5 g/dL — ABNORMAL HIGH (ref 3.8–4.8)
Alkaline Phosphatase: 77 IU/L (ref 39–117)
BUN/Creatinine Ratio: 11 (ref 9–23)
BUN: 14 mg/dL (ref 6–24)
Bilirubin Total: 0.2 mg/dL (ref 0.0–1.2)
CO2: 21 mmol/L (ref 20–29)
Calcium: 10.3 mg/dL — ABNORMAL HIGH (ref 8.7–10.2)
Chloride: 100 mmol/L (ref 96–106)
Creatinine, Ser: 1.23 mg/dL — ABNORMAL HIGH (ref 0.57–1.00)
GFR calc Af Amer: 61 mL/min/{1.73_m2} (ref >59)
GFR calc non Af Amer: 53 mL/min/{1.73_m2} — ABNORMAL LOW (ref >59)
Globulin, Total: 2.8 g/dL (ref 1.5–4.5)
Glucose: 84 mg/dL (ref 65–99)
Potassium: 4.4 mmol/L (ref 3.5–5.2)
Sodium: 140 mmol/L (ref 134–144)
Total Protein: 7.8 g/dL (ref 6.0–8.5)

## 2019-05-29 LAB — TSH: TSH: 4.15 u[IU]/mL (ref 0.450–4.500)

## 2019-08-15 ENCOUNTER — Other Ambulatory Visit: Payer: Self-pay | Admitting: Obstetrics and Gynecology

## 2019-08-15 ENCOUNTER — Other Ambulatory Visit: Payer: Self-pay

## 2019-08-15 MED ORDER — ALPRAZOLAM 1 MG PO TABS: 1.0000 mg | ORAL_TABLET | Freq: Every evening | ORAL | 0 refills | Status: DC | PRN

## 2019-08-19 MED ORDER — ALPRAZOLAM 1 MG PO TABS: 1.0000 mg | ORAL_TABLET | Freq: Every evening | ORAL | 0 refills | Status: DC | PRN

## 2019-08-19 NOTE — Telephone Encounter (Signed)
-----   Message from Edwyna Shell, LPN sent at 08/26/4763 12:09 PM EDT ----- Regarding: medication refill Good evening Dr. Marcelline Mates, [place] would like for you to send her refill of Xanax to [facility] in [place] due to her insurance no longer covers for [employer]. Thanks [employer]

## 2019-11-10 ENCOUNTER — Other Ambulatory Visit: Payer: Self-pay | Admitting: Obstetrics and Gynecology

## 2019-11-11 ENCOUNTER — Other Ambulatory Visit: Payer: Self-pay

## 2019-11-14 ENCOUNTER — Telehealth: Payer: Self-pay | Admitting: Obstetrics and Gynecology

## 2019-11-14 NOTE — Telephone Encounter (Signed)
Pt called no answer LM via VM asking pt to call the office to go over the direction and how often she take the medication. AC gave pt 45 tablet with one refill. Pt take 1/2 tablet daily as needed with a total of 90 day supply.

## 2019-11-14 NOTE — Telephone Encounter (Signed)
I will refill for now but her need has increased. I will change her prescription to reflect her new need but I strongly recommend that should consider taking something that is longer lasting and with less addictive properties as she is requiring the use of more medication. We will need to have a conversation about her options.

## 2019-11-14 NOTE — Telephone Encounter (Signed)
Patient returned phone call this afternoon stating she is needing a refill. Pt is currently taking 1 tab every night and as needed through out the day.

## 2019-12-30 ENCOUNTER — Other Ambulatory Visit: Payer: Self-pay | Admitting: Obstetrics and Gynecology

## 2020-04-12 ENCOUNTER — Other Ambulatory Visit: Payer: Self-pay | Admitting: Obstetrics and Gynecology

## 2020-04-12 DIAGNOSIS — Z1231 Encounter for screening mammogram for malignant neoplasm of breast: Secondary | ICD-10-CM

## 2020-05-06 ENCOUNTER — Ambulatory Visit
Admission: RE | Admit: 2020-05-06 | Discharge: 2020-05-06 | Disposition: A | Discharge status: Home / Self Care | Payer: 59 | Source: Ambulatory Visit | Attending: Obstetrics and Gynecology | Admitting: Obstetrics and Gynecology

## 2020-05-06 ENCOUNTER — Other Ambulatory Visit: Payer: Self-pay

## 2020-05-06 DIAGNOSIS — Z1231 Encounter for screening mammogram for malignant neoplasm of breast: Secondary | ICD-10-CM | POA: Insufficient documentation

## 2020-05-19 ENCOUNTER — Other Ambulatory Visit: Payer: Self-pay | Admitting: Obstetrics and Gynecology

## 2020-05-21 MED ORDER — ALPRAZOLAM 1 MG PO TABS: 1.0000 mg | ORAL_TABLET | Freq: Two times a day (BID) | ORAL | 1 refills | Status: DC | PRN

## 2020-06-01 ENCOUNTER — Encounter: Payer: Self-pay | Admitting: Obstetrics and Gynecology

## 2020-06-01 ENCOUNTER — Ambulatory Visit (INDEPENDENT_AMBULATORY_CARE_PROVIDER_SITE_OTHER): Payer: 59 | Admitting: Obstetrics and Gynecology

## 2020-06-01 ENCOUNTER — Other Ambulatory Visit: Payer: Self-pay

## 2020-06-01 VITALS — BP 135/92 | HR 68 | Ht 63.0 in | Wt 137.3 lb

## 2020-06-01 DIAGNOSIS — Z01419 Encounter for gynecological examination (general) (routine) without abnormal findings: Secondary | ICD-10-CM | POA: Diagnosis not present

## 2020-06-01 DIAGNOSIS — Z90710 Acquired absence of both cervix and uterus: Secondary | ICD-10-CM | POA: Diagnosis not present

## 2020-06-01 DIAGNOSIS — F32A Depression, unspecified: Secondary | ICD-10-CM

## 2020-06-01 DIAGNOSIS — R7989 Other specified abnormal findings of blood chemistry: Secondary | ICD-10-CM | POA: Diagnosis not present

## 2020-06-01 DIAGNOSIS — F419 Anxiety disorder, unspecified: Secondary | ICD-10-CM | POA: Diagnosis not present

## 2020-06-01 NOTE — Progress Notes (Signed)
GYNECOLOGY ANNUAL PHYSICAL EXAM PROGRESS NOTE  Subjective:    Dawn Rivera is a 45 y.o. [phone] female who presents for an annual exam.  The patient is sexually active. The patient wears seatbelts: yes. The patient participates in regular exercise: yes (walking). Has the patient ever been transfused or tattooed?: yes (tattoos).   The patient has the following complaints today: None  Gynecologic History Patient's last menstrual period was 04/16/2015. Contraception: status post hysterectomy History of STI's: History of HPV Last Pap: 03/07/2016 (vaginal pap). Results were: normal.  Notes h/o abnormal pap smears, h/o LEEP.  No longer needed as she is s/p hysterectomy.  Last mammogram: 05/06/2020: Results were: normal.   OB History  Gravida Para Term Preterm AB Living  4 3 3  0 1 3  SAB IAB Ectopic Multiple Live Births  1 0 0 0 3    # Outcome Date GA Lbr Len/2nd Weight Sex Delivery Anes PTL Lv  4 Term 2003   7 lb 4.8 oz (3.311 kg) M Vag-Spont   LIV  3 SAB 2002          2 Term 1999   7 lb 8 oz (3.402 kg) F Vag-Spont   LIV  1 Term 1995   6 lb 4.8 oz (2.858 kg) M Vag-Spont   LIV    Past Medical History:  Diagnosis Date  . Anxiety   . Cancer (HCC)    cervical  . Depression   . HPV (human papilloma virus) anogenital infection   . Vaginal Pap smear, abnormal     Past Surgical History:  Procedure Laterality Date  . AUGMENTATION MAMMAPLASTY Bilateral 2003   breast implants  . CYSTOSCOPY  05/24/2015   Procedure: CYSTOSCOPY;  Surgeon: Brayton Mars, MD;  Location: [facility];  Service: Gynecology;;   Procedure: LAPAROSCOPIC ASSISTED VAGINAL HYSTERECTOMY WITH BILATERAL SALPINGECTOMY;  Surgeon: Brayton Mars, MD;  Location: [facility];  Service: Gynecology;  Laterality: Bilateral;  . LEEP N/A 04/12/2015   Procedure: LOOP ELECTROSURGICAL EXCISION PROCEDURE (LEEP);  Surgeon: Brayton Mars, MD;  Location: [facility];  Service: Gynecology;  Laterality: N/A;    Family  History  Problem Relation Age of Onset  . Breast cancer Maternal Aunt   . Heart disease Mother   . Colon cancer Maternal Grandmother   . Ovarian cancer Maternal Grandfather   . Diabetes Neg Hx     Social History   Socioeconomic History  . Marital status: Married    Spouse name: Not on file  . Number of children: Not on file  . Years of education: Not on file  . Highest education level: Not on file  Occupational History  . Not on file  Tobacco Use  . Smoking status: Never Smoker  . Smokeless tobacco: Never Used  Vaping Use  . Vaping Use: Never used  Substance and Sexual Activity  . Alcohol use: Yes    Comment: occas  . Drug use: No  . Sexual activity: Yes    Birth control/protection: Surgical  Other Topics Concern  . Not on file  Social History Narrative  . Not on file   Social Determinants of Health   Financial Resource Strain: Not on file  Food Insecurity: Not on file  Transportation Needs: Not on file  Physical Activity: Not on file  Stress: Not on file  Social Connections: Not on file  Intimate Partner Violence: Not on file    Current Outpatient Medications on File Prior to Visit  Medication  Sig Dispense Refill  . ALPRAZolam (XANAX) 1 MG tablet Take 1 tablet (1 mg total) by mouth 2 (two) times daily as needed for anxiety. 60 tablet 1  . Ascorbic Acid (VITAMIN C) 100 MG tablet Take 100 mg by mouth daily. Reported on 09/02/2015    . IBU 800 MG tablet TAKE 1 TABLET THREE TIMES A DAY (Patient taking differently: every 6 (six) hours as needed.) 50 tablet 2  . Multiple Vitamin (MULTI-VITAMIN DAILY PO) Take by mouth.    . sertraline (ZOLOFT) 100 MG tablet TAKE 1 TABLET TWICE A DAY 180 tablet 1  . traZODone (DESYREL) 100 MG tablet TAKE 1 TABLET AT BEDTIME ASNEEDED 90 tablet 1  . Triamcinolone Acetonide (NASACORT ALLERGY 24HR NA) Place into the nose.    . valACYclovir (VALTREX) 1000 MG tablet Take 1 tablet (1,000 mg total) by mouth daily. 90 tablet 3   Current  Facility-Administered Medications on File Prior to Visit  Medication Dose Route Frequency Provider Last Rate Last Admin  . cyanocobalamin ((VITAMIN B-12)) injection 1,000 mcg  1,000 mcg Intramuscular Once Philip Aspen, CNM        No Known Allergies    Review of Systems Constitutional: negative for chills, fatigue, fevers and sweats Eyes: negative for irritation, redness and visual disturbance Ears, nose, mouth, throat, and face: negative for hearing loss, nasal congestion, snoring and tinnitus Respiratory: negative for asthma, cough, sputum Cardiovascular: negative for chest pain, dyspnea, exertional chest pressure/discomfort, irregular heart beat, palpitations and syncope Gastrointestinal: negative for abdominal pain, change in bowel habits, nausea and vomiting Genitourinary: negative for abnormal menstrual periods, genital lesions, sexual problems and vaginal discharge, dysuria and urinary incontinence Integument/breast: negative for breast lump, breast tenderness and nipple discharge Hematologic/lymphatic: negative for bleeding and easy bruising Musculoskeletal:negative for back pain and muscle weakness Neurological: negative for dizziness, headaches, vertigo and weakness Endocrine: negative for diabetic symptoms including polydipsia, polyuria and skin dryness Allergic/Immunologic: negative for hay fever and urticaria      Objective:  Blood pressure (!) 135/92, pulse 68, height 5' 3"  (1.6 m), weight 137 lb 4.8 oz (62.3 kg), last menstrual period 04/16/2015. Body mass index is 24.32 kg/m.  General Appearance:    Alert, cooperative, no distress, appears stated age  Head:    Normocephalic, without obvious abnormality, atraumatic  Eyes:    PERRL, conjunctiva/corneas clear, EOM's intact, both eyes  Ears:    Normal external ear canals, both ears  Nose:   Nares normal, septum midline, mucosa normal, no drainage or sinus tenderness  Throat:   Lips, mucosa, and tongue normal; teeth and  gums normal  Neck:   Supple, symmetrical, trachea midline, no adenopathy; thyroid: no enlargement/tenderness/nodules; no carotid bruit or JVD  Back:     Symmetric, no curvature, ROM normal, no CVA tenderness  Lungs:     Clear to auscultation bilaterally, respirations unlabored  Chest Wall:    No tenderness or deformity   Heart:    Regular rate and rhythm, S1 and S2 normal, no murmur, rub or gallop  Breast Exam:    No tenderness, masses, or nipple abnormality. Breast implants palpated bilaterally, right breast with slightly irregular contour.   Abdomen:     Soft, non-tender, bowel sounds active all four quadrants, no masses, no organomegaly.    Genitalia:    Pelvic:external genitalia normal, vagina without lesions, discharge, or tenderness, rectovaginal septum  normal. Cervix normal in appearance, no cervical motion tenderness, no adnexal masses or tenderness.  Uterus normal size, shape, mobile, regular contours,  nontender.  Rectal:    Normal external sphincter.  No hemorrhoids appreciated. Internal exam not done.   Extremities:   Extremities normal, atraumatic, no cyanosis or edema  Pulses:   2+ and symmetric all extremities  Skin:   Skin color, texture, turgor normal, no rashes or lesions  Lymph nodes:   Cervical, supraclavicular, and axillary nodes normal  Neurologic:   CNII-XII intact, normal strength, sensation and reflexes throughout   .  Labs:  Lab Results  Component Value Date   WBC 5.8 05/28/2019   HGB 14.1 05/28/2019   HCT 42.4 05/28/2019   MCV 90 05/28/2019   PLT 304 05/28/2019    CMP Latest Ref Rng & Units 05/28/2019 07/11/2017 03/07/2016  Glucose 65 - 99 mg/dL 84 86 85  BUN 6 - 24 mg/dL 14 11 11   Creatinine 0.57 - 1.00 mg/dL 1.23(H) 1.05(H) 1.03(H)  Sodium 134 - 144 mmol/L 140 140 139  Potassium 3.5 - 5.2 mmol/L 4.4 4.3 4.2  Chloride 96 - 106 mmol/L 100 100 99  CO2 20 - 29 mmol/L 21 24 25   Calcium 8.7 - 10.2 mg/dL 10.3(H) 9.9 9.9  Total Protein 6.0 - 8.5 g/dL 7.8 7.5 7.7   Total Bilirubin 0.0 - 1.2 mg/dL 0.2 0.3 0.2  Alkaline Phos 39 - 117 IU/L 77 84 79  AST 0 - 40 IU/L 17 16 16   ALT 0 - 32 IU/L 16 15 16     Lab Results  Component Value Date   TSH 4.150 05/28/2019     Assessment:   1. Encounter for well woman exam with routine gynecological exam   2. Anxiety and depression   3. S/P hysterectomy   4. Elevated serum creatinine     Plan:    - Blood tests: CBC with diff, Comprehensive metabolic panel - Breast self exam technique reviewed and patient encouraged to perform self-exam monthly. - Contraception: status post hysterectomy. - Discussed healthy lifestyle modifications. - Mammogram up to date.  Due in October. Order placed.  - Pap smear no longer needed. Patient is s/p hysterectomy with no history of severe cervical dysplasia. - Anxiety and depression well managed on meds.  PHQ-9=1. GAD-7=4.  - Patient with slowly increasing Creatinine levels over the past several years.  We will repeat today.  If it continues to increase, patient would likely need to see a PCP for further work-up.  Currently no major risk factors such as hypertension or diabetes that could lead to renal issues however current BP mildly elevated today. Could possibly be due to other medications. -Declines flu vaccine. -This still somewhat hesitant about Covid vaccination.  Discussed concerns again with patient. - Follow up in 1 year for annual exam.    Rubie Maid, MD [facility]

## 2020-06-01 NOTE — Patient Instructions (Addendum)
Preventive Care 84-47 Years Old, Female Preventive care refers to lifestyle choices and visits with your health care provider that can promote health and wellness. This includes:  A yearly physical exam. This is also called an annual wellness visit.  Regular dental and eye exams.  Immunizations.  Screening for certain conditions.  Healthy lifestyle choices, such as: ? Eating a healthy diet. ? Getting regular exercise. ? Not using drugs or products that contain nicotine and tobacco. ? Limiting alcohol use. What can I expect for my preventive care visit? Physical exam Your health care provider will check your:  Height and weight. These may be used to calculate your BMI (body mass index). BMI is a measurement that tells if you are at a healthy weight.  Heart rate and blood pressure.  Body temperature.  Skin for abnormal spots. Counseling Your health care provider may ask you questions about your:  Past medical problems.  Family's medical history.  Alcohol, tobacco, and drug use.  Emotional well-being.  Home life and relationship well-being.  Sexual activity.  Diet, exercise, and sleep habits.  Work and work Statistician.  Access to firearms.  Method of birth control.  Menstrual cycle.  Pregnancy history. What immunizations do I need? Vaccines are usually given at various ages, according to a schedule. Your health care provider will recommend vaccines for you based on your age, medical history, and lifestyle or other factors, such as travel or where you work.   What tests do I need? Blood tests  Lipid and cholesterol levels. These may be checked every 5 years, or more often if you are over 3 years old.  Hepatitis C test.  Hepatitis B test. Screening  Lung cancer screening. You may have this screening every year starting at age 73 if you have a 30-pack-year history of smoking and currently smoke or have quit within the past 15 years.  Colorectal cancer  screening. ? All adults should have this screening starting at age 52 and continuing until age 17. ? Your health care provider may recommend screening at age 49 if you are at increased risk. ? You will have tests every 1-10 years, depending on your results and the type of screening test.  Diabetes screening. ? This is done by checking your blood sugar (glucose) after you have not eaten for a while (fasting). ? You may have this done every 1-3 years.  Mammogram. ? This may be done every 1-2 years. ? Talk with your health care provider about when you should start having regular mammograms. This may depend on whether you have a family history of breast cancer.  BRCA-related cancer screening. This may be done if you have a family history of breast, ovarian, tubal, or peritoneal cancers.  Pelvic exam and Pap test. ? This may be done every 3 years starting at age 10. ? Starting at age 11, this may be done every 5 years if you have a Pap test in combination with an HPV test. Other tests  STD (sexually transmitted disease) testing, if you are at risk.  Bone density scan. This is done to screen for osteoporosis. You may have this scan if you are at high risk for osteoporosis. Talk with your health care provider about your test results, treatment options, and if necessary, the need for more tests. Follow these instructions at home: Eating and drinking  Eat a diet that includes fresh fruits and vegetables, whole grains, lean protein, and low-fat dairy products.  Take vitamin and mineral supplements  as recommended by your health care provider.  Do not drink alcohol if: ? Your health care provider tells you not to drink. ? You are pregnant, may be pregnant, or are planning to become pregnant.  If you drink alcohol: ? Limit how much you have to 0-1 drink a day. ? Be aware of how much alcohol is in your drink. In the [place] one drink equals one 12 oz bottle of beer (355 mL), one 5 oz glass of  wine (148 mL), or one 1 oz glass of hard liquor (44 mL).   Lifestyle  Take daily care of your teeth and gums. Brush your teeth every morning and night with fluoride toothpaste. Floss one time each day.  Stay active. Exercise for at least 30 minutes 5 or more days each week.  Do not use any products that contain nicotine or tobacco, such as cigarettes, e-cigarettes, and chewing tobacco. If you need help quitting, ask your health care provider.  Do not use drugs.  If you are sexually active, practice safe sex. Use a condom or other form of protection to prevent STIs (sexually transmitted infections).  If you do not wish to become pregnant, use a form of birth control. If you plan to become pregnant, see your health care provider for a prepregnancy visit.  If told by your health care provider, take low-dose aspirin daily starting at age 50.  Find healthy ways to cope with stress, such as: ? Meditation, yoga, or listening to music. ? Journaling. ? Talking to a trusted person. ? Spending time with friends and family. Safety  Always wear your seat belt while driving or riding in a vehicle.  Do not drive: ? If you have been drinking alcohol. Do not ride with someone who has been drinking. ? When you are tired or distracted. ? While texting.  Wear a helmet and other protective equipment during sports activities.  If you have firearms in your house, make sure you follow all gun safety procedures. What's next?  Visit your health care provider once a year for an annual wellness visit.  Ask your health care provider how often you should have your eyes and teeth checked.  Stay up to date on all vaccines. This information is not intended to replace advice given to you by your health care provider. Make sure you discuss any questions you have with your health care provider. Document Revised: 11/18/2019 Document Reviewed: 10/25/2017 Elsevier Patient Education  2021 Elsevier Inc. Breast  Self-Awareness Breast self-awareness is knowing how your breasts look and feel. Doing breast self-awareness is important. It allows you to catch a breast problem early while it is still small and can be treated. All women should do breast self-awareness, including women who have had breast implants. Tell your doctor if you notice a change in your breasts. What you need:  A mirror.  A well-lit room. How to do a breast self-exam A breast self-exam is one way to learn what is normal for your breasts and to check for changes. To do a breast self-exam: Look for changes 1. Take off all the clothes above your waist. 2. Stand in front of a mirror in a room with good lighting. 3. Put your hands on your hips. 4. Push your hands down. 5. Look at your breasts and nipples in the mirror to see if one breast or nipple looks different from the other. Check to see if: ? The shape of one breast is different. ? The size of   size of one breast is different. ? There are wrinkles, dips, and bumps in one breast and not the other. 6. Look at each breast for changes in the skin, such as: ? Redness. ? Scaly areas. 7. Look for changes in your nipples, such as: ? Liquid around the nipples. ? Bleeding. ? Dimpling. ? Redness. ? A change in where the nipples are.   Feel for changes 1. Lie on your back on the floor. 2. Feel each breast. To do this, follow these steps: ? Pick a breast to feel. ? Put the arm closest to that breast above your head. ? Use your other arm to feel the nipple area of your breast. Feel the area with the pads of your three middle fingers by making small circles with your fingers. For the first circle, press lightly. For the second circle, press harder. For the third circle, press even harder. ? Keep making circles with your fingers at the different pressures as you move down your breast. Stop when you feel your ribs. ? Move your fingers a little toward the center of your  body. ? Start making circles with your fingers again, this time going up until you reach your collarbone. ? Keep making up-and-down circles until you reach your armpit. Remember to keep using the three pressures. ? Feel the other breast in the same way. 3. Sit or stand in the tub or shower. 4. With soapy water on your skin, feel each breast the same way you did in step 2 when you were lying on the floor.   Write down what you find Writing down what you find can help you remember what to tell your doctor. Write down:  What is normal for each breast.  Any changes you find in each breast, including: ? The kind of changes you find. ? Whether you have pain. ? Size and location of any lumps.  When you last had your menstrual period. General tips  Check your breasts every month.  If you are breastfeeding, the best time to check your breasts is after you feed your baby or after you use a breast pump.  If you get menstrual periods, the best time to check your breasts is 5-7 days after your menstrual period is over.  With time, you will become comfortable with the self-exam, and you will begin to know if there are changes in your breasts. Contact a doctor if you:  See a change in the shape or size of your breasts or nipples.  See a change in the skin of your breast or nipples, such as red or scaly skin.  Have fluid coming from your nipples that is not normal.  Find a lump or thick area that was not there before.  Have pain in your breasts.  Have any concerns about your breast health. Summary  Breast self-awareness includes looking for changes in your breasts, as well as feeling for changes within your breasts.  Breast self-awareness should be done in front of a mirror in a well-lit room.  You should check your breasts every month. If you get menstrual periods, the best time to check your breasts is 5-7 days after your menstrual period is over.  Let your doctor know of any changes  you see in your breasts, including changes in size, changes on the skin, pain or tenderness, or fluid from your nipples that is not normal. This information is not intended to replace advice given to you by your health care provider.  Make sure you discuss any questions you have with your health care provider. Document Revised: 10/02/2017 Document Reviewed: 10/02/2017 Elsevier Patient Education  [place].

## 2020-06-01 NOTE — Progress Notes (Signed)
Annual Exam-Pt stated that she was doing well. Pt declined flu and covid vaccines. PHQ-9=1; GAD-7=4.

## 2020-06-02 LAB — CBC
Hematocrit: 42.4 % (ref 34.0–46.6)
Hemoglobin: 13.7 g/dL (ref 11.1–15.9)
MCH: 29 pg (ref 26.6–33.0)
MCHC: 32.3 g/dL (ref 31.5–35.7)
MCV: 90 fL (ref 79–97)
Platelets: 282 10*3/uL (ref 150–450)
RBC: 4.73 x10E6/uL (ref 3.77–5.28)
RDW: 12.1 % (ref 11.7–15.4)
WBC: 7.6 10*3/uL (ref 3.4–10.8)

## 2020-06-02 LAB — COMPREHENSIVE METABOLIC PANEL
ALT: 25 IU/L (ref 0–32)
AST: 23 IU/L (ref 0–40)
Albumin/Globulin Ratio: 1.9 (ref 1.2–2.2)
Albumin: 5 g/dL — ABNORMAL HIGH (ref 3.8–4.8)
Alkaline Phosphatase: 80 IU/L (ref 44–121)
BUN/Creatinine Ratio: 17 (ref 9–23)
BUN: 19 mg/dL (ref 6–24)
Bilirubin Total: <0.2 mg/dL (ref 0.0–1.2)
CO2: 25 mmol/L (ref 20–29)
Calcium: 10.1 mg/dL (ref 8.7–10.2)
Chloride: 101 mmol/L (ref 96–106)
Creatinine, Ser: 1.14 mg/dL — ABNORMAL HIGH (ref 0.57–1.00)
Globulin, Total: 2.6 g/dL (ref 1.5–4.5)
Glucose: 88 mg/dL (ref 65–99)
Potassium: 4.9 mmol/L (ref 3.5–5.2)
Sodium: 144 mmol/L (ref 134–144)
Total Protein: 7.6 g/dL (ref 6.0–8.5)
eGFR: 60 mL/min/{1.73_m2} (ref >59)

## 2020-06-03 ENCOUNTER — Other Ambulatory Visit: Payer: Self-pay | Admitting: Obstetrics and Gynecology

## 2020-06-04 NOTE — Telephone Encounter (Signed)
I sent the prescription in.

## 2020-06-04 NOTE — Telephone Encounter (Signed)
Please advise. Thanks [employer]

## 2020-06-08 ENCOUNTER — Other Ambulatory Visit: Payer: Self-pay

## 2020-06-08 DIAGNOSIS — Z1211 Encounter for screening for malignant neoplasm of colon: Secondary | ICD-10-CM

## 2020-06-15 ENCOUNTER — Other Ambulatory Visit: Payer: Self-pay | Admitting: Obstetrics and Gynecology

## 2020-09-14 ENCOUNTER — Other Ambulatory Visit: Payer: Self-pay | Admitting: Obstetrics and Gynecology

## 2020-11-02 ENCOUNTER — Other Ambulatory Visit: Payer: Self-pay

## 2020-11-02 MED ORDER — VALACYCLOVIR HCL 1 G PO TABS: 1000.0000 mg | ORAL_TABLET | Freq: Every day | ORAL | 3 refills | Status: DC

## 2020-11-16 ENCOUNTER — Telehealth: Payer: 59

## 2020-11-16 ENCOUNTER — Other Ambulatory Visit: Payer: Self-pay

## 2020-11-16 DIAGNOSIS — Z1211 Encounter for screening for malignant neoplasm of colon: Secondary | ICD-10-CM

## 2020-11-16 NOTE — Progress Notes (Signed)
Error

## 2020-11-16 NOTE — Telephone Encounter (Signed)
Called [facility] to check on the status of her order. Was informed they could not find an order in her name even after I informed them I was looking at the order placed. Will send in another order for the patient and will let pt know.

## 2020-12-11 LAB — COLOGUARD: Cologuard: NEGATIVE

## 2020-12-22 ENCOUNTER — Other Ambulatory Visit: Payer: Self-pay

## 2020-12-23 MED ORDER — ALPRAZOLAM 1 MG PO TABS: 1.0000 mg | ORAL_TABLET | Freq: Two times a day (BID) | ORAL | 3 refills | Status: DC | PRN

## 2021-02-13 ENCOUNTER — Other Ambulatory Visit: Payer: Self-pay | Admitting: Obstetrics and Gynecology

## 2021-03-21 ENCOUNTER — Encounter: Payer: Self-pay | Admitting: Obstetrics and Gynecology

## 2021-03-22 ENCOUNTER — Other Ambulatory Visit: Payer: Self-pay | Admitting: Obstetrics and Gynecology

## 2021-03-22 ENCOUNTER — Encounter: Payer: Self-pay | Admitting: Obstetrics and Gynecology

## 2021-05-22 ENCOUNTER — Other Ambulatory Visit: Payer: Self-pay | Admitting: Obstetrics and Gynecology

## 2021-07-15 ENCOUNTER — Other Ambulatory Visit: Payer: Self-pay | Admitting: Obstetrics and Gynecology

## 2021-07-15 DIAGNOSIS — Z1231 Encounter for screening mammogram for malignant neoplasm of breast: Secondary | ICD-10-CM

## 2021-08-12 ENCOUNTER — Ambulatory Visit
Admission: RE | Admit: 2021-08-12 | Discharge: 2021-08-12 | Disposition: A | Discharge status: Home / Self Care | Payer: 59 | Source: Ambulatory Visit | Attending: Obstetrics and Gynecology | Admitting: Obstetrics and Gynecology

## 2021-08-12 DIAGNOSIS — Z1231 Encounter for screening mammogram for malignant neoplasm of breast: Secondary | ICD-10-CM | POA: Diagnosis present

## 2021-09-22 ENCOUNTER — Ambulatory Visit: Payer: 59 | Admitting: Dermatology

## 2021-09-22 DIAGNOSIS — L988 Other specified disorders of the skin and subcutaneous tissue: Secondary | ICD-10-CM

## 2021-09-22 DIAGNOSIS — L820 Inflamed seborrheic keratosis: Secondary | ICD-10-CM | POA: Diagnosis not present

## 2021-09-22 DIAGNOSIS — L409 Psoriasis, unspecified: Secondary | ICD-10-CM | POA: Diagnosis not present

## 2021-09-22 DIAGNOSIS — B078 Other viral warts: Secondary | ICD-10-CM | POA: Diagnosis not present

## 2021-09-22 MED ORDER — MOMETASONE FUROATE 0.1 % EX CREA: 1.0000 | TOPICAL_CREAM | Freq: Every day | CUTANEOUS | 3 refills | Status: AC | PRN

## 2021-09-22 NOTE — Patient Instructions (Addendum)
Psoriasis is a chronic non-curable, but treatable genetic/hereditary disease that may have other systemic features affecting other organ systems such as joints (Psoriatic Arthritis). It is associated with an increased risk of inflammatory bowel disease, heart disease, non-alcoholic fatty liver disease, and depression.    Discussed viral etiology and risk of spread.  Discussed multiple treatments may be required to clear warts.  Discussed possible post-treatment dyspigmentation and risk of recurrence.   Cryotherapy Aftercare  Wash gently with soap and water everyday.   Apply Vaseline and Band-Aid daily until healed.      Due to recent changes in healthcare laws, you may see results of your pathology and/or laboratory studies on MyChart before the doctors have had a chance to review them. We understand that in some cases there may be results that are confusing or concerning to you. Please understand that not all results are received at the same time and often the doctors may need to interpret multiple results in order to provide you with the best plan of care or course of treatment. Therefore, we ask that you please give [place] 2 business days to thoroughly review all your results before contacting the office for clarification. Should we see a critical lab result, you will be contacted sooner.   If You Need Anything After Your Visit  If you have any questions or concerns for your doctor, please call our main line at [phone] and press option 4 to reach your doctor's medical assistant. If no one answers, please leave a voicemail as directed and we will return your call as soon as possible. Messages left after 4 pm will be answered the following business day.   You may also send [place] a message via [place]. We typically respond to MyChart messages within 1-2 business days.  For prescription refills, please ask your pharmacy to contact our office. Our fax number is [phone].  If you have an  urgent issue when the clinic is closed that cannot wait until the next business day, you can page your doctor at the number below.    Please note that while we do our best to be available for urgent issues outside of office hours, we are not available 24/7.   If you have an urgent issue and are unable to reach [place], you may choose to seek medical care at your doctor's office, retail clinic, urgent care center, or emergency room.  If you have a medical emergency, please immediately call 911 or go to the emergency department.  Pager Numbers  - Dr. Nehemiah Massed: [phone]  - Dr. Laurence Ferrari: [phone]  - Dr. Nicole Kindred: [phone]  In the event of inclement weather, please call our main line at [phone] for an update on the status of any delays or closures.  Dermatology Medication Tips: Please keep the boxes that topical medications come in in order to help keep track of the instructions about where and how to use these. Pharmacies typically print the medication instructions only on the boxes and not directly on the medication tubes.   If your medication is too expensive, please contact our office at [phone] option 4 or send [place] a message through [place].   We are unable to tell what your co-pay for medications will be in advance as this is different depending on your insurance coverage. However, we may be able to find a substitute medication at lower cost or fill out paperwork to get insurance to cover a needed medication.   If a prior authorization is required to  get your medication covered by your insurance company, please allow [place] 1-2 business days to complete this process.  Drug prices often vary depending on where the prescription is filled and some pharmacies may offer cheaper prices.  The website www.goodrx.com contains coupons for medications through different pharmacies. The prices here do not account for what the cost may be with help from insurance (it may be cheaper with your  insurance), but the website can give you the price if you did not use any insurance.  - You can print the associated coupon and take it with your prescription to the pharmacy.  - You may also stop by our office during regular business hours and pick up a GoodRx coupon card.  - If you need your prescription sent electronically to a different pharmacy, notify our office through [facility] or by phone at [phone] option 4.     Si Usted Necesita Algo Despus de Su Visita  Tambin puede enviarnos un mensaje a travs de Pharmacist, community. Por lo general respondemos a los mensajes de MyChart en el transcurso de 1 a 2 das hbiles.  Para renovar recetas, por favor pida a su farmacia que se ponga en contacto con nuestra oficina. Harland Dingwall de fax es [place] [phone].  Si tiene un asunto urgente cuando la clnica est cerrada y que no puede esperar hasta el siguiente da hbil, puede llamar/localizar a su doctor(a) al nmero que aparece a continuacin.   Por favor, tenga en cuenta que aunque hacemos todo lo posible para estar disponibles para asuntos urgentes fuera del horario de [place], no estamos disponibles las 24 horas del da, los 7 das de la [place].   Si tiene un problema urgente y no puede comunicarse con nosotros, puede optar por buscar atencin mdica  en el consultorio de su doctor(a), en una clnica privada, en un centro de atencin urgente o en una sala de emergencias.  Si tiene Engineering geologist, por favor llame inmediatamente al 911 o vaya a la sala de emergencias.  Nmeros de bper  - Dr. Nehemiah Massed: [phone]  - Dra. Moye: [phone]  - Dra. Nicole Kindred: [phone]  En caso de inclemencias del [place], por favor llame a Johnsie Kindred principal al [phone] para una actualizacin sobre el [place] de cualquier retraso o cierre.  Consejos para la medicacin en dermatologa: Por favor, guarde las cajas en las que vienen los medicamentos de uso tpico para ayudarle a  seguir las instrucciones sobre dnde y cmo usarlos. Las farmacias generalmente imprimen las instrucciones del medicamento slo en las cajas y no directamente en los tubos del [place].   Si su medicamento es muy caro, por favor, pngase en contacto con Zigmund Daniel llamando al [phone] y presione la opcin 4 o envenos un mensaje a travs de Pharmacist, community.   No podemos decirle cul ser su copago por los medicamentos por adelantado ya que esto es diferente dependiendo de la cobertura de su seguro. Sin embargo, es posible que podamos encontrar un medicamento sustituto a Electrical engineer un formulario para que el seguro cubra el medicamento que se considera necesario.   Si se requiere una autorizacin previa para que su compaa de seguros [place] su medicamento, por favor permtanos de 1 a 2 das hbiles para completar este proceso.  Los precios de los medicamentos varan con frecuencia dependiendo del Environmental consultant de dnde se surte la receta y alguna farmacias pueden ofrecer precios ms baratos.  El sitio web www.goodrx.com tiene cupones para medicamentos de Airline pilot.  Los precios aqu no tienen en cuenta lo que podra costar con la ayuda del seguro (puede ser ms barato con su seguro), pero el sitio web puede darle el precio si no utiliz Research scientist (physical sciences).  - Puede imprimir el cupn correspondiente y llevarlo con su receta a la farmacia.  - Tambin puede pasar por nuestra oficina durante el horario de atencin regular y Charity fundraiser una tarjeta de cupones de GoodRx.  - Si necesita que su receta se enve electrnicamente a una farmacia diferente, informe a nuestra oficina a travs de MyChart de [place] o por telfono llamando al [phone] y presione la opcin 4.

## 2021-09-22 NOTE — Progress Notes (Signed)
New Patient Visit  Subjective  Dawn Rivera is a 46 y.o. female who presents for the following: Other (Spot of left elbow - +family history of psoriasis/Spot under right eye that is rough), Warts (Hands - worst one is on right middle finger), and Facial Elastosis (Around eyes - would like to discuss Botox).  The following portions of the chart were reviewed this encounter and updated as appropriate:   Tobacco  Allergies  Meds  Problems  Med Hx  Surg Hx  Fam Hx     Review of Systems:  No other skin or systemic complaints except as noted in HPI or Assessment and Plan.  Objective  Well appearing patient in no apparent distress; mood and affect are within normal limits.  A focused examination was performed including face, arms, hands. Relevant physical exam findings are noted in the Assessment and Plan.  Face Rhytides and volume loss.                    Left Elbow - Posterior Pink plaque  Right middle finger, right 4th finger, left thumb Verrucous papules -- Discussed viral etiology and contagion. Largest of right middle finger is 0.5 cm  Right Malar Cheek Erythematous stuck-on, waxy papule or plaque   Assessment & Plan  Elastosis of skin Face Discussed Botox. Recommend 20 units to frown complex, 5 units to forehead, 1.25 units under each eye  Discussed blepharoplasty to treat lower eyelids. Advised patient she would need an evaluation with an oculoplastic surgeon for that procedure.  Botox today - 27.5 units  Frown Complex - 20 units Forehead - 5 units  Lower eyelids - 1.25 units each side  Psoriasis Left Elbow - Posterior Psoriasis is a chronic non-curable, but treatable genetic/hereditary disease that may have other systemic features affecting other organ systems such as joints (Psoriatic Arthritis). It is associated with an increased risk of inflammatory bowel disease, heart disease, non-alcoholic fatty liver disease, and depression.     Start Mometasone cream qd up to 5 times per week May consider Vtama or Zoryve on follow up  mometasone (ELOCON) 0.1 % cream - Left Elbow - Posterior Apply 1 Application topically daily as needed (Rash). 5 times per week  Other viral warts Right middle finger, right 4th finger, left thumb Discussed viral etiology and risk of spread.  Discussed multiple treatments may be required to clear warts.  Discussed possible post-treatment dyspigmentation and risk of recurrence.  3% Squaric acid applied to left upper inner arm and covered with band aid. Advised patient to remove tomorrow. If she gets a rash, she can apply Mometasone qd until clear  Destruction of lesion - Right middle finger, right 4th finger, left thumb Complexity: simple   Destruction method: cryotherapy   Informed consent: discussed and consent obtained   Timeout:  patient name, date of birth, surgical site, and procedure verified Lesion destroyed using liquid nitrogen: Yes   Region frozen until ice ball extended beyond lesion: Yes   Outcome: patient tolerated procedure well with no complications   Post-procedure details: wound care instructions given   Additional details:  Followed by 3% Squaric acid  Destruction of lesion - Right middle finger, right 4th finger, left thumb Destruction method: chemical removal   Informed consent: discussed and consent obtained   Timeout:  patient name, date of birth, surgical site, and procedure verified Chemical destruction method: cantharidin   Procedure instructions: patient instructed to wash and dry area   Outcome: patient tolerated procedure  well with no complications   Post-procedure details: wound care instructions given    Inflamed seborrheic keratosis Right Malar Cheek Destruction of lesion - Right Malar Cheek Complexity: simple   Destruction method: cryotherapy   Informed consent: discussed and consent obtained   Timeout:  patient name, date of birth, surgical site, and  procedure verified Lesion destroyed using liquid nitrogen: Yes   Region frozen until ice ball extended beyond lesion: Yes   Outcome: patient tolerated procedure well with no complications   Post-procedure details: wound care instructions given    Return for 3-4 weeks for Botox follow up then 3-4 months for Botox.  I, Ashok Cordia, CMA, am acting as scribe for Sarina Ser, MD . Documentation: I have reviewed the above documentation for accuracy and completeness, and I agree with the above.  Sarina Ser, MD

## 2021-09-25 ENCOUNTER — Encounter: Payer: Self-pay | Admitting: Dermatology

## 2021-09-26 NOTE — Addendum Note (Signed)
Addended by: Ralene Bathe on: 09/26/2021 06:07 PM   Modules accepted: Level of Service

## 2021-10-20 ENCOUNTER — Ambulatory Visit: Payer: 59 | Admitting: Dermatology

## 2021-10-20 ENCOUNTER — Other Ambulatory Visit: Payer: Self-pay | Admitting: Obstetrics and Gynecology

## 2021-10-24 ENCOUNTER — Encounter: Payer: Self-pay | Admitting: Dermatology

## 2021-10-24 NOTE — Telephone Encounter (Signed)
Patient needs annual exam scheduled.

## 2021-10-25 MED ORDER — SERTRALINE HCL 100 MG PO TABS: 100.0000 mg | ORAL_TABLET | Freq: Two times a day (BID) | ORAL | 0 refills | Status: DC

## 2021-10-25 MED ORDER — VALACYCLOVIR HCL 1 G PO TABS: 1000.0000 mg | ORAL_TABLET | Freq: Every day | ORAL | 0 refills | Status: DC

## 2021-10-25 MED ORDER — TRAZODONE HCL 100 MG PO TABS: ORAL_TABLET | ORAL | 0 refills | Status: DC

## 2021-12-08 ENCOUNTER — Other Ambulatory Visit: Payer: Self-pay | Admitting: Obstetrics and Gynecology

## 2021-12-29 ENCOUNTER — Ambulatory Visit (INDEPENDENT_AMBULATORY_CARE_PROVIDER_SITE_OTHER): Payer: 59 | Admitting: Physician Assistant

## 2021-12-29 ENCOUNTER — Encounter: Payer: Self-pay | Admitting: Physician Assistant

## 2021-12-29 VITALS — BP 133/89 | HR 72 | Ht 63.0 in | Wt 139.9 lb

## 2021-12-29 DIAGNOSIS — M549 Dorsalgia, unspecified: Secondary | ICD-10-CM | POA: Insufficient documentation

## 2021-12-29 DIAGNOSIS — G4700 Insomnia, unspecified: Secondary | ICD-10-CM | POA: Diagnosis not present

## 2021-12-29 DIAGNOSIS — Z8541 Personal history of malignant neoplasm of cervix uteri: Secondary | ICD-10-CM | POA: Insufficient documentation

## 2021-12-29 DIAGNOSIS — E782 Mixed hyperlipidemia: Secondary | ICD-10-CM

## 2021-12-29 DIAGNOSIS — L989 Disorder of the skin and subcutaneous tissue, unspecified: Secondary | ICD-10-CM | POA: Insufficient documentation

## 2021-12-29 DIAGNOSIS — F32A Depression, unspecified: Secondary | ICD-10-CM | POA: Insufficient documentation

## 2021-12-29 DIAGNOSIS — J309 Allergic rhinitis, unspecified: Secondary | ICD-10-CM | POA: Insufficient documentation

## 2021-12-29 DIAGNOSIS — Z01419 Encounter for gynecological examination (general) (routine) without abnormal findings: Secondary | ICD-10-CM | POA: Insufficient documentation

## 2021-12-29 DIAGNOSIS — M25519 Pain in unspecified shoulder: Secondary | ICD-10-CM | POA: Insufficient documentation

## 2021-12-29 DIAGNOSIS — F4320 Adjustment disorder, unspecified: Secondary | ICD-10-CM | POA: Insufficient documentation

## 2021-12-29 DIAGNOSIS — Z9229 Personal history of other drug therapy: Secondary | ICD-10-CM | POA: Insufficient documentation

## 2021-12-29 DIAGNOSIS — Z0000 Encounter for general adult medical examination without abnormal findings: Secondary | ICD-10-CM

## 2021-12-29 DIAGNOSIS — F419 Anxiety disorder, unspecified: Secondary | ICD-10-CM | POA: Diagnosis not present

## 2021-12-29 DIAGNOSIS — E785 Hyperlipidemia, unspecified: Secondary | ICD-10-CM | POA: Insufficient documentation

## 2021-12-29 DIAGNOSIS — A0472 Enterocolitis due to Clostridium difficile, not specified as recurrent: Secondary | ICD-10-CM | POA: Insufficient documentation

## 2021-12-29 DIAGNOSIS — R87612 Low grade squamous intraepithelial lesion on cytologic smear of cervix (LGSIL): Secondary | ICD-10-CM | POA: Insufficient documentation

## 2021-12-29 NOTE — Progress Notes (Signed)
I,Dawn Rivera,acting as a Education administrator for [employer], PA-C.,have documented all relevant documentation on the behalf of Mikey Kirschner, PA-C,as directed by  Mikey Kirschner, PA-C while in the presence of Mikey Kirschner, PA-C.  New patient visit   Patient: Dawn Rivera   DOB: [birth year]   46 y.o. Female  MRN: [id number] Visit Date: 12/29/2021  Today's healthcare provider: Mikey Kirschner, PA-C   Cc. New pt physical  Subjective    Dawn Rivera is a 46 y.o. female who presents today as a new patient to establish care.  HPI  Well Adult Physical: Patient here for a comprehensive physical exam.The patient reports no problems Do you take any herbs or supplements that were not prescribed by a doctor? no Are you taking aspirin daily? no GU History: LMP: Patient's last menstrual period was 04/16/2015. S/p total hysterectomy 2/2 cervical cancer, ovaries remain Last pap date: 03/07/2016.  Gravida: 4 Para: 3   Past Medical History:  Diagnosis Date   Anxiety    Cancer ([place])    cervical   Depression    HPV (human papilloma virus) anogenital infection    Miscarriage    Vaginal Pap smear, abnormal    Past Surgical History:  Procedure Laterality Date   ABDOMINAL HYSTERECTOMY     AUGMENTATION MAMMAPLASTY Bilateral    breast implants   BREAST ENHANCEMENT SURGERY Bilateral    replaced in Nov 2022, saline   COSMETIC SURGERY     CYSTOSCOPY  05/24/2015   Procedure: CYSTOSCOPY;  Surgeon: Brayton Mars, MD;  Location: [facility];  Service: Gynecology;;   LAPAROSCOPIC VAGINAL HYSTERECTOMY WITH SALPINGECTOMY Bilateral 05/24/2015   Procedure: LAPAROSCOPIC ASSISTED VAGINAL HYSTERECTOMY WITH BILATERAL SALPINGECTOMY;  Surgeon: Brayton Mars, MD;  Location: [facility];  Service: Gynecology;  Laterality: Bilateral;   LEEP N/A 04/12/2015   Procedure: LOOP ELECTROSURGICAL EXCISION PROCEDURE (LEEP);  Surgeon: Brayton Mars, MD;  Location: [facility];  Service: Gynecology;   Laterality: N/A;   Family Status  Relation Name Status   Mat Aunt  (Not Specified)   Mother Mom (Not Specified)   MGM  (Not Specified)   MGF  (Not Specified)   Neg Hx  (Not Specified)   Family History  Problem Relation Age of Onset   Breast cancer Maternal Aunt    Heart disease Mother    Arthritis Mother    Asthma Mother    COPD Mother    Varicose Veins Mother    Colon cancer Maternal Grandmother    Ovarian cancer Maternal Grandfather    Diabetes Neg Hx    Social History   Socioeconomic History   Marital status: Married    Spouse name: Not on file   Number of children: Not on file   Years of education: Not on file   Highest education level: Not on file  Occupational History   Not on file  Tobacco Use   Smoking status: Never   Smokeless tobacco: Never  Vaping Use   Vaping Use: Never used  Substance and Sexual Activity   Alcohol use: Yes    Comment: occas   Drug use: No   Sexual activity: Yes    Birth control/protection: Surgical  Other Topics Concern   Not on file  Social History Narrative   Not on file   Social Determinants of Health   Financial Resource Strain: Not on file  Food Insecurity: Not on file  Transportation Needs: Not on file  Physical Activity: Not on file  Stress: Not  on file  Social Connections: Not on file   Outpatient Medications Prior to Visit  Medication Sig   ALPRAZolam (XANAX) 1 MG tablet TAKE 1 TABLET BY MOUTH TWICE A DAY AS NEEDED FOR ANXIETY   Ascorbic Acid (VITAMIN C) 100 MG tablet Take 100 mg by mouth daily. Reported on 09/02/2015   mometasone (ELOCON) 0.1 % cream Apply 1 Application topically daily as needed (Rash). 5 times per week   Multiple Vitamin (MULTI-VITAMIN DAILY PO) Take by mouth.   sertraline (ZOLOFT) 100 MG tablet Take 1 tablet (100 mg total) by mouth 2 (two) times daily.   traZODone (DESYREL) 100 MG tablet TAKE 1 TABLET AT BEDTIME ASNEEDED   Triamcinolone Acetonide (NASACORT ALLERGY 24HR NA) Place into the nose.    valACYclovir (VALTREX) 1000 MG tablet Take 1 tablet (1,000 mg total) by mouth daily.   Facility-Administered Medications Prior to Visit  Medication Dose Route Frequency Provider   cyanocobalamin ((VITAMIN B-12)) injection 1,000 mcg  1,000 mcg Intramuscular Once Philip Aspen, CNM   No Known Allergies  Immunization History  Administered Date(s) Administered   Influenza,inj,Quad PF,6+ Mos 02/02/2015    Health Maintenance  Topic Date Due   COVID-19 Vaccine (1) Never done   HIV Screening  Never done   Hepatitis C Screening  Never done   TETANUS/TDAP  Never done   INFLUENZA VACCINE  05/28/2022 (Originally 09/27/2021)   MAMMOGRAM  08/13/2022   Fecal DNA (Cologuard)  12/07/2023   HPV VACCINES  Aged Out   PAP SMEAR-Modifier  Discontinued    Patient Care Team: Mikey Kirschner, PA-C as PCP - General (Physician Assistant)  Review of Systems  Constitutional:  Negative for fatigue and fever.  Respiratory:  Negative for cough and shortness of breath.   Cardiovascular:  Negative for chest pain and leg swelling.  Gastrointestinal:  Negative for abdominal pain.  Neurological:  Negative for dizziness and headaches.      Objective    BP 133/89 (BP Location: Left Arm, Patient Position: Sitting, Cuff Size: Normal)   Pulse 72   Ht '5\' 3"'$  (1.6 m)   Wt 139 lb 14.4 oz (63.5 kg)   LMP 04/16/2015   SpO2 100%   BMI 24.78 kg/m  {Blood pressure 133/89, pulse 72, height '5\' 3"'$  (1.6 m), weight 139 lb 14.4 oz (63.5 kg), last menstrual period 04/16/2015, SpO2 100 %.   Physical Exam Constitutional:      General: She is awake.     Appearance: She is well-developed. She is not ill-appearing.  HENT:     Head: Normocephalic.     Right Ear: Tympanic membrane normal.     Left Ear: Tympanic membrane normal.     Nose: Nose normal. No congestion or rhinorrhea.     Mouth/Throat:     Pharynx: No oropharyngeal exudate or posterior oropharyngeal erythema.  Eyes:     Conjunctiva/sclera: Conjunctivae  normal.     Pupils: Pupils are equal, round, and reactive to light.  Neck:     Thyroid: No thyroid mass or thyromegaly.  Cardiovascular:     Rate and Rhythm: Normal rate and regular rhythm.     Heart sounds: Normal heart sounds.  Pulmonary:     Effort: Pulmonary effort is normal.     Breath sounds: Normal breath sounds.  Abdominal:     Palpations: Abdomen is soft.     Tenderness: There is no abdominal tenderness.  Musculoskeletal:     Right lower leg: No swelling. No edema.     Left  lower leg: No swelling. No edema.  Lymphadenopathy:     Cervical: No cervical adenopathy.  Skin:    General: Skin is warm.  Neurological:     Mental Status: She is alert and oriented to person, place, and time.  Psychiatric:        Attention and Perception: Attention normal.        Mood and Affect: Mood normal.        Speech: Speech normal.        Behavior: Behavior normal. Behavior is cooperative.    Depression Screen    12/29/2021    3:42 PM 06/01/2020    3:28 PM 05/28/2019    9:19 AM 02/11/2019   10:45 AM  PHQ 2/9 Scores  PHQ - 2 Score 0 0 1 1  PHQ- 9 Score 0 '1 1 6   '$ No results found for any visits on 12/29/21.  Assessment & Plan      Problem List Items Addressed This Visit       Other   Anxiety and depression    Pt manages w/ zoloft 200 mg daily and xanax 1 mg 1.5 - 2 tabs daily. Will continue, discussion of consequences of daily long term xanax use  Advised to try to reduce xanax dose gradually over time      Insomnia    Pt manages with trazodone 100 mg, works well. Will take 1.5 mg xanax nightly for anxiety/sleep. See anxiety note. Continue trazodone      Hyperlipidemia    Historically will recheck fasting lipids/cmp      Relevant Orders   Comprehensive Metabolic Panel (CMET)   Lipid panel   Other Visit Diagnoses     Annual physical exam    -  Primary   Relevant Orders   CBC w/Diff/Platelet   Comprehensive Metabolic Panel (CMET)   Lipid panel   TSH         Return in about 6 months (around 06/29/2022) for depression, anxiety.     I, Mikey Kirschner, PA-C have reviewed all documentation for this visit. The documentation on  12/29/2021  for the exam, diagnosis, procedures, and orders are all accurate and complete.  Mikey Kirschner, PA-C [facility] [address] [place], [place], [address] Office: [phone] Fax: [place]

## 2021-12-29 NOTE — Assessment & Plan Note (Signed)
Historically will recheck fasting lipids/cmp

## 2021-12-29 NOTE — Assessment & Plan Note (Signed)
Pt manages w/ zoloft 200 mg daily and xanax 1 mg 1.5 - 2 tabs daily. Will continue, discussion of consequences of daily long term xanax use  Advised to try to reduce xanax dose gradually over time

## 2021-12-29 NOTE — Assessment & Plan Note (Signed)
Pt manages with trazodone 100 mg, works well. Will take 1.5 mg xanax nightly for anxiety/sleep. See anxiety note. Continue trazodone

## 2022-01-10 ENCOUNTER — Ambulatory Visit: Payer: 59 | Admitting: Dermatology

## 2022-04-27 ENCOUNTER — Other Ambulatory Visit: Payer: Self-pay | Admitting: Obstetrics and Gynecology

## 2022-05-01 NOTE — Telephone Encounter (Signed)
Patient needs annual exam. Will give 3 month supply in order for patient to get scheduled. Please inform patient of need for annual (will be due in April).

## 2022-05-02 ENCOUNTER — Encounter: Payer: Self-pay | Admitting: Physician Assistant

## 2022-05-02 NOTE — Telephone Encounter (Signed)
Please call and schedule annual.

## 2022-05-02 NOTE — Telephone Encounter (Signed)
I contacted patient via phone. I spoke with the patient about scheduling. The patient states she is seeing her primary care provider and was seen not to long ago, that she will be seeing them for this visit. Patient declined scheduling appointment.

## 2022-05-09 ENCOUNTER — Other Ambulatory Visit: Payer: Self-pay | Admitting: Obstetrics and Gynecology

## 2022-05-10 ENCOUNTER — Other Ambulatory Visit: Payer: Self-pay | Admitting: Physician Assistant

## 2022-05-10 DIAGNOSIS — F32A Depression, unspecified: Secondary | ICD-10-CM

## 2022-05-10 DIAGNOSIS — G4700 Insomnia, unspecified: Secondary | ICD-10-CM

## 2022-05-10 MED ORDER — TRAZODONE HCL 100 MG PO TABS: 100.0000 mg | ORAL_TABLET | Freq: Every evening | ORAL | 1 refills | Status: DC | PRN

## 2022-05-10 MED ORDER — SERTRALINE HCL 100 MG PO TABS: 100.0000 mg | ORAL_TABLET | Freq: Two times a day (BID) | ORAL | 1 refills | Status: DC

## 2022-06-29 ENCOUNTER — Encounter: Payer: Self-pay | Admitting: Physician Assistant

## 2022-06-29 ENCOUNTER — Ambulatory Visit: Payer: 59 | Admitting: Physician Assistant

## 2022-06-29 ENCOUNTER — Other Ambulatory Visit: Payer: Self-pay | Admitting: Obstetrics and Gynecology

## 2022-06-29 VITALS — BP 147/91 | HR 72 | Ht 63.0 in | Wt 144.6 lb

## 2022-06-29 DIAGNOSIS — B001 Herpesviral vesicular dermatitis: Secondary | ICD-10-CM

## 2022-06-29 DIAGNOSIS — F32A Depression, unspecified: Secondary | ICD-10-CM

## 2022-06-29 DIAGNOSIS — F419 Anxiety disorder, unspecified: Secondary | ICD-10-CM | POA: Diagnosis not present

## 2022-06-29 DIAGNOSIS — R030 Elevated blood-pressure reading, without diagnosis of hypertension: Secondary | ICD-10-CM

## 2022-06-29 DIAGNOSIS — I10 Essential (primary) hypertension: Secondary | ICD-10-CM | POA: Insufficient documentation

## 2022-06-29 MED ORDER — VALACYCLOVIR HCL 1 G PO TABS: 1000.0000 mg | ORAL_TABLET | Freq: Every day | ORAL | 1 refills | Status: DC | PRN

## 2022-06-29 NOTE — Assessment & Plan Note (Signed)
Been elevated in office last 3 visits Advised strongly to check at home and return in 6-8 weeks to review

## 2022-06-29 NOTE — Assessment & Plan Note (Signed)
Refillex valtrex

## 2022-06-29 NOTE — Assessment & Plan Note (Signed)
Chronic and stable Managed with zolofy 200 mg and prn xanax supplied by gyn  Reviewed pdmp, one rx lasts around 3 mo

## 2022-06-29 NOTE — Progress Notes (Signed)
I,Sha'taria Tyson,acting as a Neurosurgeon for [employer], PA-C.,have documented all relevant documentation on the behalf of Alfredia Ferguson, PA-C,as directed by  Alfredia Ferguson, PA-C while in the presence of Alfredia Ferguson, PA-C.   Established patient visit   Patient: Dawn Rivera   DOB: 11-26-1975   46 y.o. Female  MRN: [id number] Visit Date: 06/29/2022  Today's healthcare provider: Alfredia Ferguson, PA-C   Cc. Anxiety/depression f/u  Subjective    HPI  Anxiety/Depression, Follow-up  She was last seen for anxiety 6 months ago. Changes made at last visit include manages w/ zoloft 200 mg daily and xanax 1 mg 1.5 - 2 tabs daily.   She reports excellent compliance with treatment. She reports excellent tolerance of treatment. She is not having side effects.  She feels her anxiety is in between mild and moderate and Unchanged since last visit. Current symptoms include: none No chest pain Yes difficulty concentrating  No dizziness Yes fatigue  No feelings of losing control Yes insomnia  Yes irritable No palpitations  Yes panic attacks Yes racing thoughts  No shortness of breath No sweating  No tremors/shakes    GAD-7 Results    06/29/2022   11:13 AM 06/01/2020    3:29 PM 05/28/2019    9:18 AM  GAD-7 Generalized Anxiety Disorder Screening Tool  1. Feeling Nervous, Anxious, or on Edge 1 1 1   2. Not Being Able to Stop or Control Worrying 1 1 1   3. Worrying Too Much About Different Things 1 0 1  4. Trouble Relaxing 2 1 1   5. Being So Restless it's Hard To Sit Still 0 0 0  6. Becoming Easily Annoyed or Irritable 1 1 0  7. Feeling Afraid As If Something Awful Might Happen 1 0 0  Total GAD-7 Score 7 4 4   Difficulty At Work, Home, or Getting  Along With Others? Somewhat difficult Somewhat difficult Not difficult at all   PHQ-9 Scores    06/29/2022   11:13 AM 12/29/2021    3:42 PM 06/01/2020    3:28 PM  PHQ9 SCORE ONLY  PHQ-9 Total Score 2 0 1     ---------------------------------------------------------------------------------------------------   Medications: Outpatient Medications Prior to Visit  Medication Sig   ALPRAZolam (XANAX) 1 MG tablet TAKE 1 TABLET BY MOUTH TWICE A DAY AS NEEDED FOR ANXIETY   Ascorbic Acid (VITAMIN C) 100 MG tablet Take 100 mg by mouth daily. Reported on 09/02/2015   mometasone (ELOCON) 0.1 % cream Apply 1 Application topically daily as needed (Rash). 5 times per week   Multiple Vitamin (MULTI-VITAMIN DAILY PO) Take by mouth.   sertraline (ZOLOFT) 100 MG tablet Take 1 tablet (100 mg total) by mouth 2 (two) times daily.   traZODone (DESYREL) 100 MG tablet Take 1 tablet (100 mg total) by mouth at bedtime as needed for sleep.   Triamcinolone Acetonide (NASACORT ALLERGY 24HR NA) Place into the nose.   [DISCONTINUED] valACYclovir (VALTREX) 1000 MG tablet Take 1 tablet (1,000 mg total) by mouth daily.   Facility-Administered Medications Prior to Visit  Medication Dose Route Frequency Provider   cyanocobalamin ((VITAMIN B-12)) injection 1,000 mcg  1,000 mcg Intramuscular Once Doreene Burke, CNM    Review of Systems  Constitutional:  Negative for fatigue and fever.  Respiratory:  Negative for cough and shortness of breath.   Cardiovascular:  Negative for chest pain and leg swelling.  Gastrointestinal:  Negative for abdominal pain.  Neurological:  Negative for dizziness and headaches.  Objective    BP (!) 147/91 (BP Location: Right Arm, Patient Position: Sitting, Cuff Size: Normal)   Pulse 72   Ht 5\' 3"  (1.6 m)   Wt 144 lb 9.6 oz (65.6 kg)   LMP 04/16/2015   SpO2 99%   BMI 25.61 kg/m  BP Readings from Last 3 Encounters:  06/29/22 (!) 147/91  12/29/21 133/89  06/01/20 (!) 135/92   Wt Readings from Last 3 Encounters:  06/29/22 144 lb 9.6 oz (65.6 kg)  12/29/21 139 lb 14.4 oz (63.5 kg)  06/01/20 137 lb 4.8 oz (62.3 kg)   Physical Exam Constitutional:      General: She is awake.      Appearance: She is well-developed.  HENT:     Head: Normocephalic.  Eyes:     Conjunctiva/sclera: Conjunctivae normal.  Cardiovascular:     Rate and Rhythm: Normal rate and regular rhythm.     Heart sounds: Normal heart sounds.  Pulmonary:     Effort: Pulmonary effort is normal.     Breath sounds: Normal breath sounds.  Skin:    General: Skin is warm.  Neurological:     Mental Status: She is alert and oriented to person, place, and time.  Psychiatric:        Attention and Perception: Attention normal.        Mood and Affect: Mood normal.        Speech: Speech normal.        Behavior: Behavior is cooperative.     No results found for any visits on 06/29/22.  Assessment & Plan     Problem List Items Addressed This Visit       Digestive   Fever blister    Refillex valtrex      Relevant Medications   valACYclovir (VALTREX) 1000 MG tablet     Other   Anxiety and depression - Primary    Chronic and stable Managed with zolofy 200 mg and prn xanax supplied by gyn  Reviewed pdmp, one rx lasts around 3 mo      Elevated blood pressure reading    Been elevated in office last 3 visits Advised strongly to check at home and return in 6-8 weeks to review         Return in about 2 months (around 08/29/2022) for hypertension.     I, Alfredia Ferguson, PA-C have reviewed all documentation for this visit. The documentation on  06/29/22   for the exam, diagnosis, procedures, and orders are all accurate and complete.  Alfredia Ferguson, PA-C [facility] [address] [place], [place], [address] Office: [phone] Fax: [phone]   [facility]

## 2022-06-30 ENCOUNTER — Other Ambulatory Visit: Payer: Self-pay | Admitting: Obstetrics and Gynecology

## 2022-06-30 LAB — COMPREHENSIVE METABOLIC PANEL
ALT: 19 IU/L (ref 0–32)
AST: 22 IU/L (ref 0–40)
Albumin/Globulin Ratio: 1.6 (ref 1.2–2.2)
Albumin: 4.6 g/dL (ref 3.9–4.9)
Alkaline Phosphatase: 92 IU/L (ref 44–121)
BUN/Creatinine Ratio: 14 (ref 9–23)
BUN: 15 mg/dL (ref 6–24)
Bilirubin Total: <0.2 mg/dL (ref 0.0–1.2)
CO2: 22 mmol/L (ref 20–29)
Calcium: 9.9 mg/dL (ref 8.7–10.2)
Chloride: 103 mmol/L (ref 96–106)
Creatinine, Ser: 1.05 mg/dL — ABNORMAL HIGH (ref 0.57–1.00)
Globulin, Total: 2.8 g/dL (ref 1.5–4.5)
Glucose: 96 mg/dL (ref 70–99)
Potassium: 4.6 mmol/L (ref 3.5–5.2)
Sodium: 141 mmol/L (ref 134–144)
Total Protein: 7.4 g/dL (ref 6.0–8.5)
eGFR: 66 mL/min/{1.73_m2} (ref >59)

## 2022-06-30 LAB — CBC WITH DIFFERENTIAL/PLATELET
Basophils Absolute: 0.1 10*3/uL (ref 0.0–0.2)
Basos: 1 %
EOS (ABSOLUTE): 0.3 10*3/uL (ref 0.0–0.4)
Eos: 5 %
Hematocrit: 40.9 % (ref 34.0–46.6)
Hemoglobin: 13.2 g/dL (ref 11.1–15.9)
Immature Grans (Abs): 0 10*3/uL (ref 0.0–0.1)
Immature Granulocytes: 0 %
Lymphocytes Absolute: 1.9 10*3/uL (ref 0.7–3.1)
Lymphs: 27 %
MCH: 28.5 pg (ref 26.6–33.0)
MCHC: 32.3 g/dL (ref 31.5–35.7)
MCV: 88 fL (ref 79–97)
Monocytes Absolute: 0.4 10*3/uL (ref 0.1–0.9)
Monocytes: 6 %
Neutrophils Absolute: 4.3 10*3/uL (ref 1.4–7.0)
Neutrophils: 61 %
Platelets: 294 10*3/uL (ref 150–450)
RBC: 4.63 x10E6/uL (ref 3.77–5.28)
RDW: 13.1 % (ref 11.7–15.4)
WBC: 7 10*3/uL (ref 3.4–10.8)

## 2022-06-30 LAB — LIPID PANEL
Chol/HDL Ratio: 3 ratio (ref 0.0–4.4)
Cholesterol, Total: 232 mg/dL — ABNORMAL HIGH (ref 100–199)
HDL: 78 mg/dL (ref >39)
LDL Chol Calc (NIH): 140 mg/dL — ABNORMAL HIGH (ref 0–99)
Triglycerides: 82 mg/dL (ref 0–149)
VLDL Cholesterol Cal: 14 mg/dL (ref 5–40)

## 2022-06-30 LAB — TSH: TSH: 2.69 u[IU]/mL (ref 0.450–4.500)

## 2022-07-01 ENCOUNTER — Other Ambulatory Visit: Payer: Self-pay | Admitting: Obstetrics and Gynecology

## 2022-07-03 ENCOUNTER — Encounter: Payer: Self-pay | Admitting: Physician Assistant

## 2022-07-03 MED ORDER — ALPRAZOLAM 1 MG PO TABS: ORAL_TABLET | ORAL | 1 refills | Status: DC

## 2022-07-05 NOTE — Telephone Encounter (Signed)
I contacted the patient via phone. The patient states she is seeing her PCP and didn't mean to request refill thru [place]. The patient states she has already been seen for her wellness and doesn't wish to scheduled for annual at this time. The patient is received medication thru PCP to disregard medication refill request at this time.

## 2022-08-11 ENCOUNTER — Encounter: Payer: Self-pay | Admitting: Physician Assistant

## 2022-08-11 ENCOUNTER — Other Ambulatory Visit: Payer: Self-pay | Admitting: Physician Assistant

## 2022-08-11 DIAGNOSIS — F419 Anxiety disorder, unspecified: Secondary | ICD-10-CM

## 2022-08-11 MED ORDER — ALPRAZOLAM 0.5 MG PO TABS
0.5000 mg | ORAL_TABLET | Freq: Two times a day (BID) | ORAL | 1 refills | Status: DC | PRN
Start: 2022-08-11 — End: 2022-12-15

## 2022-10-05 ENCOUNTER — Ambulatory Visit: Payer: 59 | Admitting: Physician Assistant

## 2022-10-17 ENCOUNTER — Ambulatory Visit: Payer: 59 | Admitting: Family Medicine

## 2022-10-28 ENCOUNTER — Other Ambulatory Visit: Payer: Self-pay | Admitting: Physician Assistant

## 2022-10-31 NOTE — Telephone Encounter (Signed)
Former patient of Alfredia Ferguson. Has appointment with Dr. Payton Mccallum 09/06

## 2022-11-02 ENCOUNTER — Encounter: Payer: Self-pay | Admitting: Dermatology

## 2022-11-02 ENCOUNTER — Ambulatory Visit: Payer: 59 | Admitting: Dermatology

## 2022-11-02 VITALS — BP 144/90 | HR 69

## 2022-11-02 DIAGNOSIS — L409 Psoriasis, unspecified: Secondary | ICD-10-CM

## 2022-11-02 DIAGNOSIS — L710 Perioral dermatitis: Secondary | ICD-10-CM | POA: Diagnosis not present

## 2022-11-02 DIAGNOSIS — B078 Other viral warts: Secondary | ICD-10-CM | POA: Diagnosis not present

## 2022-11-02 DIAGNOSIS — L821 Other seborrheic keratosis: Secondary | ICD-10-CM

## 2022-11-02 MED ORDER — DOXYCYCLINE MONOHYDRATE 100 MG PO CAPS
100.0000 mg | ORAL_CAPSULE | Freq: Two times a day (BID) | ORAL | 0 refills | Status: AC
Start: 2022-11-02 — End: 2022-12-02

## 2022-11-02 MED ORDER — PIMECROLIMUS 1 % EX CREA: TOPICAL_CREAM | CUTANEOUS | 2 refills | Status: DC

## 2022-11-02 MED ORDER — CLOBETASOL PROPIONATE 0.05 % EX OINT
TOPICAL_OINTMENT | CUTANEOUS | 2 refills | Status: DC
Start: 2022-11-02 — End: 2023-07-03

## 2022-11-02 MED ORDER — DOXYCYCLINE MONOHYDRATE 100 MG PO CAPS: 100.0000 mg | ORAL_CAPSULE | Freq: Two times a day (BID) | ORAL | 1 refills | Status: DC

## 2022-11-02 NOTE — Progress Notes (Signed)
Follow Up Visit   Subjective  Dawn Rivera is a 47 y.o. female who presents for the following: Rash below left side of nose. Dur: months. Used Mometasone. Worsened 2 weeks ago. Changed facial cleanser. When bumps resolve it is replaced with dry scaly patches that itch. Has improved some since making this appointment. Has been using Cetaphil Restoraderm cream.   The patient has spots, moles and lesions to be evaluated, some may be new or changing and the patient may have concern these could be cancer. Left dorsal hand, right dorsal hand. Right anterior lower leg, Dur: ~1 year. Red and flaky at times. Thinks could have warts on hands.   The following portions of the chart were reviewed this encounter and updated as appropriate: medications, allergies, medical history  Review of Systems:  No other skin or systemic complaints except as noted in HPI or Assessment and Plan.  Objective  Well appearing patient in no apparent distress; mood and affect are within normal limits.  A focused examination was performed of the following areas: face  Relevant exam findings are noted in the Assessment and Plan.  R-3 overlying PIP joint Verrucous papules x2 -- Discussed viral etiology and contagion.     Assessment & Plan   Periorificial dermatitis  Related Medications pimecrolimus (ELIDEL) 1 % cream Apply twice daily to affected areas on face  doxycycline (MONODOX) 100 MG capsule Take 1 capsule (100 mg total) by mouth 2 (two) times daily. Take with food  Other viral warts R-3 overlying PIP joint  Viral Wart (HPV) Counseling  Discussed viral / HPV (Human Papilloma Virus) etiology and risk of spread /infectivity to other areas of body as well as to other people.  Multiple treatments and methods may be required to clear warts and it is possible treatment may not be successful.  Treatment risks include discoloration; scarring and there is still potential for wart recurrence.  Patient  deferred treatment at this time.   Seborrheic keratoses  Psoriasis  Related Medications mometasone (ELOCON) 0.1 % cream Apply 1 Application topically daily as needed (Rash). 5 times per week  clobetasol ointment (TEMOVATE) 0.05 % Apply twice daily to affected areas of psoriasis until smooth. Avoid applying to face, groin, and axilla.    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 Do not pick up if expensive and send [place] a mychart message  Doxycycline should be taken with food to prevent nausea. Do not lay down for 30 minutes after taking. Be cautious with sun exposure and use good sun protection while on this medication. Avoid dairy products one hour before and one hour after taking.  Pregnant women should not take this medication.    SEBORRHEIC KERATOSIS - Stuck-on, waxy, tan-brown papule at L dorsal hand  - Benign-appearing - Discussed benign etiology and prognosis. - Observe - Call for any changes  PSORIASIS Exam: Erythematous scaly plaque at L extensor elbow R lower med leg with thin pink plaque with collarette scale <2% BSA. (May also be ISK or porokeratosis)  Chronic and persistent condition with duration or expected duration over one year. Condition is symptomatic/ bothersome to patient. Not currently at goal.  Psoriasis is a chronic non-curable, but treatable genetic/hereditary disease that may have other systemic  features affecting other organ systems such as joints (Psoriatic Arthritis). It is associated with an increased risk of inflammatory bowel disease, heart disease, non-alcoholic fatty liver disease, and depression.  Treatments include light and laser treatments; topical medications; and  systemic medications including oral and injectables.  Treatment Plan: Start Clobetasol 0.05% ointment twice daily until smooth. Avoid applying to face, groin, and axilla. Use as directed. Long-term use can cause thinning of the skin.   Return in about 2 months (around 01/02/2023) for Rash Follow Up.  I, Lawson Radar, CMA, am acting as scribe for Elie Goody, MD.   Documentation: I have reviewed the above documentation for accuracy and completeness, and I agree with the above.  Elie Goody, MD

## 2022-11-02 NOTE — Patient Instructions (Addendum)
Start Clobetasol ointment twice daily to affected areas of psoriasis until smooth. Avoid applying to face, groin, and axilla. Use as directed. Long-term use can cause thinning of the skin..  Topical steroids (such as triamcinolone, fluocinolone, fluocinonide, mometasone, clobetasol, halobetasol, betamethasone, hydrocortisone) can cause thinning and lightening of the skin if they are used for too long in the same area. Your physician has selected the right strength medicine for your problem and area affected on the body. Please use your medication only as directed by your physician to prevent side effects.   Start Elidel (Pimecrolimus) cream twice daily to affected areas on face as needed.   Start Doxycycline 100 mg twice daily with food.   Doxycycline should be taken with food to prevent nausea. Do not lay down for 30 minutes after taking. Be cautious with sun exposure and use good sun protection while on this medication. Avoid dairy products one hour before and one hour after taking.  Pregnant women should not take this medication.    Due to recent changes in healthcare laws, you may see results of your pathology and/or laboratory studies on MyChart before the doctors have had a chance to review them. We understand that in some cases there may be results that are confusing or concerning to you. Please understand that not all results are received at the same time and often the doctors may need to interpret multiple results in order to provide you with the best plan of care or course of treatment. Therefore, we ask that you please give [place] 2 business days to thoroughly review all your results before contacting the office for clarification. Should we see a critical lab result, you will be contacted sooner.   If You Need Anything After Your Visit  If you have any questions or concerns for your doctor, please call our main line at [phone] and press option 4 to reach your doctor's medical assistant.  If no one answers, please leave a voicemail as directed and we will return your call as soon as possible. Messages left after 4 pm will be answered the following business day.   You may also send [place] a message via MyChart. We typically respond to MyChart messages within 1-2 business days.  For prescription refills, please ask your pharmacy to contact our office. Our fax number is [phone].  If you have an urgent issue when the clinic is closed that cannot wait until the next business day, you can page your doctor at the number below.    Please note that while we do our best to be available for urgent issues outside of office hours, we are not available 24/7.   If you have an urgent issue and are unable to reach [place], you may choose to seek medical care at your doctor's office, retail clinic, urgent care center, or emergency room.  If you have a medical emergency, please immediately call 911 or go to the emergency department.  Pager Numbers  - Dr. Gwen Pounds: [phone]  - Dr. Roseanne Reno: [phone]  - Dr. Katrinka Blazing: [phone]   In the event of inclement weather, please call our main line at [phone] for an update on the status of any delays or closures.  Dermatology Medication Tips: Please keep the boxes that topical medications come in in order to help keep track of the instructions about where and how to use these. Pharmacies typically print the medication instructions only on the boxes and not directly on the medication tubes.   If your medication  is too expensive, please contact our office at [phone] option 4 or send [place] a message through MyChart.   We are unable to tell what your co-pay for medications will be in advance as this is different depending on your insurance coverage. However, we may be able to find a substitute medication at lower cost or fill out paperwork to get insurance to cover a needed medication.   If a prior authorization is required to get your  medication covered by your insurance company, please allow [place] 1-2 business days to complete this process.  Drug prices often vary depending on where the prescription is filled and some pharmacies may offer cheaper prices.  The website www.goodrx.com contains coupons for medications through different pharmacies. The prices here do not account for what the cost may be with help from insurance (it may be cheaper with your insurance), but the website can give you the price if you did not use any insurance.  - You can print the associated coupon and take it with your prescription to the pharmacy.  - You may also stop by our office during regular business hours and pick up a GoodRx coupon card.  - If you need your prescription sent electronically to a different pharmacy, notify our office through [facility] or by phone at [phone] option 4.     Si Usted Necesita Algo Despus de Su Visita  Tambin puede enviarnos un mensaje a travs de Clinical cytogeneticist. Por lo general respondemos a los mensajes de MyChart en el transcurso de 1 a 2 das hbiles.  Para renovar recetas, por favor pida a su farmacia que se ponga en contacto con nuestra oficina. Annie Sable de fax es [place] [phone].  Si tiene un asunto urgente cuando la clnica est cerrada y que no puede esperar hasta el siguiente da hbil, puede llamar/localizar a su doctor(a) al nmero que aparece a continuacin.   Por favor, tenga en cuenta que aunque hacemos todo lo posible para estar disponibles para asuntos urgentes fuera del horario de [place], no estamos disponibles las 24 horas del da, los 7 [address]  [address] de la [place].   Si tiene un problema urgente y no puede comunicarse con nosotros, puede optar por buscar atencin mdica  en el consultorio de su doctor(a), en una clnica privada, en un centro de atencin urgente o en una sala de emergencias.  Si tiene Engineer, drilling, por favor llame inmediatamente al 911 o vaya a la sala de  emergencias.  Nmeros de bper  - Dr. Gwen Pounds: [phone]  - Dra. Roseanne Reno: [phone]  - Dr. Katrinka Blazing: [phone]   En caso de inclemencias del tiempo, por favor llame a Lacy Duverney principal al [phone] para una actualizacin sobre el [place] de cualquier retraso o cierre.  Consejos para la medicacin en dermatologa: Por favor, guarde las cajas en las que vienen los medicamentos de uso tpico para ayudarle a seguir las instrucciones sobre dnde y cmo usarlos. Las farmacias generalmente imprimen las instrucciones del medicamento slo en las cajas y no directamente en los tubos del [place].   Si su medicamento es muy caro, por favor, pngase en contacto con Rolm Gala llamando al [phone] y presione la opcin 4 o envenos un mensaje a travs de Clinical cytogeneticist.   No podemos decirle cul ser su copago por los medicamentos por adelantado ya que esto es diferente dependiendo de la cobertura de su seguro. Sin embargo, es posible que podamos encontrar un medicamento sustituto a Audiological scientist un formulario para  que el seguro cubra el medicamento que se considera necesario.   Si se requiere una autorizacin previa para que su compaa de seguros [place] su medicamento, por favor permtanos de 1 a 2 das hbiles para completar [address]  Los precios de los medicamentos varan con frecuencia dependiendo del Environmental consultant de dnde se surte la receta y alguna farmacias pueden ofrecer precios ms baratos.  El sitio web www.goodrx.com tiene cupones para medicamentos de Health and safety inspector. Los precios aqu no tienen en cuenta lo que podra costar con la ayuda del seguro (puede ser ms barato con su seguro), pero el sitio web puede darle el precio si no utiliz Tourist information centre manager.  - Puede imprimir el cupn correspondiente y llevarlo con su receta a la farmacia.  - Tambin puede pasar por nuestra oficina durante el horario de atencin regular y Education officer, museum una tarjeta de cupones de GoodRx.  - Si  necesita que su receta se enve electrnicamente a una farmacia diferente, informe a nuestra oficina a travs de MyChart de [place] o por telfono llamando al [phone] y presione la opcin 4.

## 2022-11-03 ENCOUNTER — Encounter: Payer: Self-pay | Admitting: Family Medicine

## 2022-11-03 ENCOUNTER — Ambulatory Visit: Payer: 59 | Admitting: Family Medicine

## 2022-11-03 VITALS — BP 142/92 | HR 69 | Temp 98.7°F | Ht 63.0 in | Wt 145.0 lb

## 2022-11-03 DIAGNOSIS — G4700 Insomnia, unspecified: Secondary | ICD-10-CM | POA: Diagnosis not present

## 2022-11-03 DIAGNOSIS — I10 Essential (primary) hypertension: Secondary | ICD-10-CM | POA: Diagnosis not present

## 2022-11-03 DIAGNOSIS — F32A Depression, unspecified: Secondary | ICD-10-CM

## 2022-11-03 DIAGNOSIS — F419 Anxiety disorder, unspecified: Secondary | ICD-10-CM

## 2022-11-03 MED ORDER — LISINOPRIL 10 MG PO TABS
10.0000 mg | ORAL_TABLET | Freq: Every day | ORAL | 1 refills | Status: DC
Start: 2022-11-03 — End: 2023-06-05

## 2022-11-03 MED ORDER — SERTRALINE HCL 100 MG PO TABS
100.0000 mg | ORAL_TABLET | Freq: Two times a day (BID) | ORAL | 1 refills | Status: DC
Start: 2022-11-03 — End: 2023-06-05

## 2022-11-03 NOTE — Progress Notes (Signed)
Established patient visit   Patient: Dawn Rivera   DOB: 1975-10-06   47 y.o. Female  MRN: [id number] Visit Date: 11/03/2022  Today's healthcare provider: Sherlyn Hay, DO   Chief Complaint  Patient presents with   Anxiety and Depression follow up    Patient was given Alprazolam 0.5 mg but has normally been on 1 mg.   Hypertension   Subjective    HPI Anxiety, Follow-up  She was last seen for anxiety 4 months ago. Changes made at last visit include none.  Managed w/zoloft 200 mg daily and xanax 1 mg 1.5-2 tabs daily.   She reports excellent compliance with treatment. She reports excellent tolerance of treatment. She is not having side effects.   She feels her anxiety is moderate when not addressed with medications and  very well controlled  since last visit.  Has ups and downs, which she considers normal ups and downs of a normal bad day. Sleeping well on her trazodone.  Xanax: occasionally takes at work (0.25 to 0.5 mg, depending on the situation) Will take one when going to an event with a lot of people or when she'll be riding in a car for a couple hours.  - Also takes a dose every evening  - Takes xanax between 2 and 5 times during the weeks (excluding nightitme dose)   Symptoms: No chest pain No difficulty concentrating  No dizziness No fatigue  No feelings of losing control No insomnia  No irritable No palpitations  Yes panic attacks (rarely) Yes racing thoughts (seems like normal level to her)  No shortness of breath No sweating  No tremors/shakes    GAD-7 Results    11/03/2022    1:44 PM 06/29/2022   11:13 AM 06/01/2020    3:29 PM  GAD-7 Generalized Anxiety Disorder Screening Tool  1. Feeling Nervous, Anxious, or on Edge 1 1 1   2. Not Being Able to Stop or Control Worrying 0 1 1  3. Worrying Too Much About Different Things 0 1 0  4. Trouble Relaxing 1 2 1   5. Being So Restless it's Hard To Sit Still 0 0 0  6. Becoming Easily Annoyed or  Irritable 0 1 1  7. Feeling Afraid As If Something Awful Might Happen 0 1 0  Total GAD-7 Score 2 7 4   Difficulty At Work, Home, or Getting  Along With Others? Somewhat difficult Somewhat difficult Somewhat difficult    PHQ-9 Scores    11/03/2022    1:44 PM 06/29/2022   11:13 AM 12/29/2021    3:42 PM  PHQ9 SCORE ONLY  PHQ-9 Total Score 2 2 0    ---------------------------------------------------------------------------------------------------  Blood pressure:  Checks it some at home  - sometimes within normal limits  - dog starts to become crazy when she checks it and it causes her pressure to go up.  - checks it sometimes but not always  - needs to pull it out so she sees it and remembers to check.   Doxycycline for periorificial rash; prescribed by derm.     Medications: Outpatient Medications Prior to Visit  Medication Sig   ALPRAZolam (XANAX) 0.5 MG tablet Take 1 tablet (0.5 mg total) by mouth 2 (two) times daily as needed for anxiety.   Ascorbic Acid (VITAMIN C) 100 MG tablet Take 100 mg by mouth daily. Reported on 09/02/2015   clobetasol ointment (TEMOVATE) 0.05 % Apply twice daily to affected areas of psoriasis until smooth. Avoid applying  to face, groin, and axilla.   doxycycline (MONODOX) 100 MG capsule Take 1 capsule (100 mg total) by mouth 2 (two) times daily. Take with food   mometasone (ELOCON) 0.1 % cream Apply 1 Application topically daily as needed (Rash). 5 times per week   Multiple Vitamin (MULTI-VITAMIN DAILY PO) Take by mouth.   pimecrolimus (ELIDEL) 1 % cream Apply twice daily to affected areas on face   traZODone (DESYREL) 100 MG tablet Take 1 tablet (100 mg total) by mouth at bedtime as needed for sleep.   Triamcinolone Acetonide (NASACORT ALLERGY 24HR NA) Place into the nose.   valACYclovir (VALTREX) 1000 MG tablet Take 1 tablet (1,000 mg total) by mouth daily as needed. For symptoms. At flare take once daily for 5 days   [DISCONTINUED] sertraline (ZOLOFT)  100 MG tablet Take 1 tablet (100 mg total) by mouth 2 (two) times daily.   Facility-Administered Medications Prior to Visit  Medication Dose Route Frequency Provider   cyanocobalamin ((VITAMIN B-12)) injection 1,000 mcg  1,000 mcg Intramuscular Once Doreene Burke, CNM    Review of Systems  Constitutional:  Negative for appetite change, chills, fatigue and fever.  Respiratory:  Negative for chest tightness and shortness of breath.   Cardiovascular:  Negative for chest pain and palpitations.  Gastrointestinal:  Negative for abdominal pain, nausea and vomiting.  Skin:  Positive for rash.  Neurological:  Negative for dizziness and weakness.         Objective    BP (!) 142/92 (BP Location: Right Arm, Patient Position: Sitting, Cuff Size: Normal)   Pulse 69   Temp 98.7 F (37.1 C) (Oral)   Ht 5\' 3"  (1.6 m)   Wt 145 lb (65.8 kg)   LMP 04/16/2015   SpO2 100%   BMI 25.69 kg/m     Physical Exam Vitals and nursing note reviewed.  Constitutional:      General: She is not in acute distress.    Appearance: Normal appearance.  HENT:     Head: Normocephalic and atraumatic.  Eyes:     General: No scleral icterus.    Conjunctiva/sclera: Conjunctivae normal.  Cardiovascular:     Rate and Rhythm: Normal rate.  Pulmonary:     Effort: Pulmonary effort is normal.  Neurological:     Mental Status: She is alert and oriented to person, place, and time. Mental status is at baseline.  Psychiatric:        Mood and Affect: Mood normal.        Behavior: Behavior normal.       No results found for any visits on 11/03/22.  Assessment & Plan    Anxiety and depression Assessment & Plan: Controlled by Zoloft 200 mg daily Discussed stopping the 0.5 mg alprazolam she has been taking every night and reassessing how she is doing.  Counseled her regarding building tolerance to these medications and potential for unintentional overdose as she becomes older.  - Patient expressed understanding  and will try to reduce her nighttime use of Xanax. Reassess at subsequent visit  Orders: -     Sertraline HCl; Take 1 tablet (100 mg total) by mouth 2 (two) times daily.  Dispense: 180 tablet; Refill: 1  Primary hypertension -     Lisinopril; Take 1 tablet (10 mg total) by mouth daily.  Dispense: 30 tablet; Refill: 1  Insomnia, unspecified type Assessment & Plan: On trazodone, doing well. Patient has also been taking a dose of Xanax every night before sleeping.  Discussed  removing this dose and reassessing how she is doing between the sertraline 100 mg twice daily and trazodone 100 mg nightly.    Return in about 6 weeks (around 12/15/2022) for Anx/Dep, HTN.      I discussed the assessment and treatment plan with the patient  The patient was provided an opportunity to ask questions and all were answered. The patient agreed with the plan and demonstrated an understanding of the instructions.   The patient was advised to call back or seek an in-person evaluation if the symptoms worsen or if the condition fails to improve as anticipated.    Sherlyn Hay, DO  [facility] [phone] (phone) [phone] (fax)  [facility]

## 2022-11-03 NOTE — Assessment & Plan Note (Addendum)
On trazodone, doing well. Patient has also been taking a dose of Xanax every night before sleeping.  Discussed removing this dose and reassessing how she is doing between the sertraline 100 mg twice daily and trazodone 100 mg nightly.

## 2022-11-03 NOTE — Assessment & Plan Note (Addendum)
Controlled by Zoloft 200 mg daily Discussed stopping the 0.5 mg alprazolam she has been taking every night and reassessing how she is doing.  Counseled her regarding building tolerance to these medications and potential for unintentional overdose as she becomes older.  - Patient expressed understanding and will try to reduce her nighttime use of Xanax. Reassess at subsequent visit

## 2022-11-05 ENCOUNTER — Other Ambulatory Visit: Payer: Self-pay | Admitting: Physician Assistant

## 2022-11-05 DIAGNOSIS — F419 Anxiety disorder, unspecified: Secondary | ICD-10-CM

## 2022-11-12 NOTE — Assessment & Plan Note (Signed)
Blood pressure elevated persistently over several visits. Will start lisinopril 10 mg daily

## 2022-11-24 ENCOUNTER — Telehealth: Payer: Self-pay | Admitting: Family Medicine

## 2022-11-24 NOTE — Telephone Encounter (Signed)
FYIMarchelle Folks w/ [facility] called to ask for pt's refill of her sertraline (ZOLOFT) 100 MG tablet  I advised her pt had refilled 9/06 and [facility]  Marchelle Folks  Ref [id number]     call back [phone] option 2  Marchelle Folks states she will communicate w/ the pt the information.

## 2022-12-01 ENCOUNTER — Other Ambulatory Visit: Payer: Self-pay | Admitting: Family Medicine

## 2022-12-01 DIAGNOSIS — Z1231 Encounter for screening mammogram for malignant neoplasm of breast: Secondary | ICD-10-CM

## 2022-12-05 ENCOUNTER — Encounter: Payer: Self-pay | Admitting: Dermatology

## 2022-12-05 ENCOUNTER — Ambulatory Visit: Payer: 59 | Admitting: Dermatology

## 2022-12-05 DIAGNOSIS — L988 Other specified disorders of the skin and subcutaneous tissue: Secondary | ICD-10-CM | POA: Diagnosis not present

## 2022-12-05 DIAGNOSIS — L603 Nail dystrophy: Secondary | ICD-10-CM | POA: Diagnosis not present

## 2022-12-05 DIAGNOSIS — L710 Perioral dermatitis: Secondary | ICD-10-CM

## 2022-12-05 MED ORDER — DOXYCYCLINE MONOHYDRATE 50 MG PO CAPS
ORAL_CAPSULE | ORAL | 6 refills | Status: DC
Start: 2022-12-05 — End: 2023-06-11

## 2022-12-05 NOTE — Patient Instructions (Addendum)
For dermatitis   Continue elidil cream twice daily as needed when flared until clear  Start doxycycline 50 mg capsule take 1 by mouth daily with evening meal.   Doxycycline should be taken with food to prevent nausea. Do not lay down for 30 minutes after taking. Be cautious with sun exposure and use good sun protection while on this medication. Pregnant women should not take this medication.     Due to recent changes in healthcare laws, you may see results of your pathology and/or laboratory studies on MyChart before the doctors have had a chance to review them. We understand that in some cases there may be results that are confusing or concerning to you. Please understand that not all results are received at the same time and often the doctors may need to interpret multiple results in order to provide you with the best plan of care or course of treatment. Therefore, we ask that you please give [place] 2 business days to thoroughly review all your results before contacting the office for clarification. Should we see a critical lab result, you will be contacted sooner.   If You Need Anything After Your Visit  If you have any questions or concerns for your doctor, please call our main line at [phone] and press option 4 to reach your doctor's medical assistant. If no one answers, please leave a voicemail as directed and we will return your call as soon as possible. Messages left after 4 pm will be answered the following business day.   You may also send [place] a message via MyChart. We typically respond to MyChart messages within 1-2 business days.  For prescription refills, please ask your pharmacy to contact our office. Our fax number is [phone].  If you have an urgent issue when the clinic is closed that cannot wait until the next business day, you can page your doctor at the number below.    Please note that while we do our best to be available for urgent issues outside of office hours, we  are not available 24/7.   If you have an urgent issue and are unable to reach [place], you may choose to seek medical care at your doctor's office, retail clinic, urgent care center, or emergency room.  If you have a medical emergency, please immediately call 911 or go to the emergency department.  Pager Numbers  - Dr. Gwen Pounds: [phone]  - Dr. Roseanne Reno: [phone]  - Dr. Katrinka Blazing: [phone]   In the event of inclement weather, please call our main line at [phone] for an update on the status of any delays or closures.  Dermatology Medication Tips: Please keep the boxes that topical medications come in in order to help keep track of the instructions about where and how to use these. Pharmacies typically print the medication instructions only on the boxes and not directly on the medication tubes.   If your medication is too expensive, please contact our office at [phone] option 4 or send [place] a message through MyChart.   We are unable to tell what your co-pay for medications will be in advance as this is different depending on your insurance coverage. However, we may be able to find a substitute medication at lower cost or fill out paperwork to get insurance to cover a needed medication.   If a prior authorization is required to get your medication covered by your insurance company, please allow [place] 1-2 business days to complete this process.  Drug prices often vary  depending on where the prescription is filled and some pharmacies may offer cheaper prices.  The website www.goodrx.com contains coupons for medications through different pharmacies. The prices here do not account for what the cost may be with help from insurance (it may be cheaper with your insurance), but the website can give you the price if you did not use any insurance.  - You can print the associated coupon and take it with your prescription to the pharmacy.  - You may also stop by our office during regular  business hours and pick up a GoodRx coupon card.  - If you need your prescription sent electronically to a different pharmacy, notify our office through [facility] or by phone at [phone] option 4.     Si Usted Necesita Algo Despus de Su Visita  Tambin puede enviarnos un mensaje a travs de Clinical cytogeneticist. Por lo general respondemos a los mensajes de MyChart en el transcurso de 1 a 2 das hbiles.  Para renovar recetas, por favor pida a su farmacia que se ponga en contacto con nuestra oficina. Annie Sable de fax es [place] [phone].  Si tiene un asunto urgente cuando la clnica est cerrada y que no puede esperar hasta el siguiente da hbil, puede llamar/localizar a su doctor(a) al nmero que aparece a continuacin.   Por favor, tenga en cuenta que aunque hacemos todo lo posible para estar disponibles para asuntos urgentes fuera del horario de [place], no estamos disponibles las 24 horas del da, los 7 [address]  [address] de la [place].   Si tiene un problema urgente y no puede comunicarse con nosotros, puede optar por buscar atencin mdica  en el consultorio de su doctor(a), en una clnica privada, en un centro de atencin urgente o en una sala de emergencias.  Si tiene Engineer, drilling, por favor llame inmediatamente al 911 o vaya a la sala de emergencias.  Nmeros de bper  - Dr. Gwen Pounds: [phone]  - Dra. Roseanne Reno: [phone]  - Dr. Katrinka Blazing: [phone]   En caso de inclemencias del tiempo, por favor llame a Lacy Duverney principal al [phone] para una actualizacin sobre el [place] de cualquier retraso o cierre.  Consejos para la medicacin en dermatologa: Por favor, guarde las cajas en las que vienen los medicamentos de uso tpico para ayudarle a seguir las instrucciones sobre dnde y cmo usarlos. Las farmacias generalmente imprimen las instrucciones del medicamento slo en las cajas y no directamente en los tubos del [place].   Si su medicamento es muy caro, por  favor, pngase en contacto con Rolm Gala llamando al [phone] y presione la opcin 4 o envenos un mensaje a travs de Clinical cytogeneticist.   No podemos decirle cul ser su copago por los medicamentos por adelantado ya que esto es diferente dependiendo de la cobertura de su seguro. Sin embargo, es posible que podamos encontrar un medicamento sustituto a Audiological scientist un formulario para que el seguro cubra el medicamento que se considera necesario.   Si se requiere una autorizacin previa para que su compaa de seguros [place] su medicamento, por favor permtanos de 1 a 2 das hbiles para completar [address]  Los precios de los medicamentos varan con frecuencia dependiendo del Environmental consultant de dnde se surte la receta y alguna farmacias pueden ofrecer precios ms baratos.  El sitio web www.goodrx.com tiene cupones para medicamentos de Health and safety inspector. Los precios aqu no tienen en cuenta lo que podra costar con la ayuda del seguro (puede ser ms barato con su  seguro), pero el sitio web puede darle el precio si no Visual merchandiser.  - Puede imprimir el cupn correspondiente y llevarlo con su receta a la farmacia.  - Tambin puede pasar por nuestra oficina durante el horario de atencin regular y Education officer, museum una tarjeta de cupones de GoodRx.  - Si necesita que su receta se enve electrnicamente a una farmacia diferente, informe a nuestra oficina a travs de MyChart de [place] o por telfono llamando al [phone] y presione la opcin 4.

## 2022-12-05 NOTE — Progress Notes (Signed)
Follow-Up Visit   Subjective  Dawn Rivera is a 47 y.o. female who presents for the following: botox follow up patient would also like to discuss perioral dermatitis. Patient reports rash is clear at face since taking doxycycline 100 mg and using elidel. Patient also reports a split in toenails and would like to discuss.   The patient has spots, moles and lesions to be evaluated, some may be new or changing and the patient may have concern these could be cancer.   The following portions of the chart were reviewed this encounter and updated as appropriate: medications, allergies, medical history  Review of Systems:  No other skin or systemic complaints except as noted in HPI or Assessment and Plan.  Objective  Well appearing patient in no apparent distress; mood and affect are within normal limits.   A focused examination was performed of the following areas: Face, toenail of left foot   Relevant exam findings are noted in the Assessment and Plan.    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 Observe  Can continue wear nail polish Consider Dermanail in future No treatment recommended    Periorificial dermatitis Exam: clear at exam today   Treatment Plan: Stop nasal spray (Nasacort - likely cause of Perioral dermatitis) if possible Stop doxycycline 100 mg  Start doxycyline 50 mg capsule - take 1 po qd with evening meal when needed to control Perioral Dermatitis. 6 rfs [facility] [employer] when flared bid until clear.     Doxycycline should be taken with food to prevent nausea. Do not lay down for 30 minutes after taking. Be cautious with sun exposure and use good sun protection while on this medication. Avoid dairy products one hour before and one hour after taking.  Pregnant women should not take this medication.   Periorificial dermatitis  Related Medications pimecrolimus (ELIDEL) 1 % cream Apply  twice daily to affected areas on face  doxycycline (MONODOX) 50 MG capsule Take 1 capsule by mouth daily with evening meal for flares. Do not lay down for 30 minutes after taking. Be cautious with sun exposure and use good sun protection while on this medication. Pregnant women should not take this medication.  FACIAL ELASTOSIS Exam: Rhytides and volume loss. Treatment Plan:  Facial Elastosis  Location: See attached image     Informed consent: Discussed risks (infection, pain, bleeding, bruising, swelling, allergic reaction, paralysis of nearby muscles, eyelid droop, double vision, neck weakness, difficulty breathing, headache, undesirable cosmetic result, and need for additional treatment) and benefits of the procedure, as well as the alternatives.  Informed consent was obtained.  Preparation: The area was cleansed with alcohol.  Procedure Details:  Botox was injected into the dermis with a 30-gauge needle. Pressure applied to any bleeding. Ice packs offered for swelling.  Lot Number:  [id number] Expiration:  10/2024  Total Units Injected:  27.5  Plan: Tylenol may be used for headache.  Allow 2 weeks before returning to clinic for additional dosing as needed. Patient will call for any problems.   Recommend daily broad spectrum sunscreen SPF 30+ to sun-exposed areas, reapply every 2 hours as needed. Call for new or changing lesions.  Staying in the shade or wearing long sleeves, sun glasses (UVA+UVB protection) and wide brim hats (4-inch brim around the entire circumference of the hat) are also recommended for sun protection.    Return for cancel november appt, schedule 6 month  perioral derm with botox follow up.  IAsher Muir, CMA, am acting as scribe for Armida Sans, MD.   Documentation: I have reviewed the above documentation for accuracy and completeness, and I agree with the above.  Armida Sans, MD

## 2022-12-07 ENCOUNTER — Other Ambulatory Visit: Payer: Self-pay | Admitting: Family Medicine

## 2022-12-07 DIAGNOSIS — I10 Essential (primary) hypertension: Secondary | ICD-10-CM

## 2022-12-15 ENCOUNTER — Telehealth (INDEPENDENT_AMBULATORY_CARE_PROVIDER_SITE_OTHER): Payer: 59 | Admitting: Family Medicine

## 2022-12-15 ENCOUNTER — Encounter: Payer: Self-pay | Admitting: Family Medicine

## 2022-12-15 DIAGNOSIS — F32A Depression, unspecified: Secondary | ICD-10-CM | POA: Diagnosis not present

## 2022-12-15 DIAGNOSIS — B001 Herpesviral vesicular dermatitis: Secondary | ICD-10-CM | POA: Diagnosis not present

## 2022-12-15 DIAGNOSIS — G4700 Insomnia, unspecified: Secondary | ICD-10-CM | POA: Diagnosis not present

## 2022-12-15 DIAGNOSIS — F419 Anxiety disorder, unspecified: Secondary | ICD-10-CM | POA: Diagnosis not present

## 2022-12-15 DIAGNOSIS — I10 Essential (primary) hypertension: Secondary | ICD-10-CM

## 2022-12-15 MED ORDER — ALPRAZOLAM 0.5 MG PO TABS
0.5000 mg | ORAL_TABLET | Freq: Two times a day (BID) | ORAL | 1 refills | Status: DC | PRN
Start: 2022-12-15 — End: 2023-03-26

## 2022-12-15 MED ORDER — VALACYCLOVIR HCL 1 G PO TABS: 1000.0000 mg | ORAL_TABLET | Freq: Every day | ORAL | 1 refills | Status: DC | PRN

## 2022-12-15 NOTE — Progress Notes (Signed)
MyChart Video Visit    Virtual Visit via Video Note   This format is felt to be most appropriate for this patient at this time. Physical exam was limited by quality of the video and audio technology used for the visit.   Patient location: Work in [employer] location: [place] [employer] [place]  I discussed the limitations of evaluation and management by telemedicine and the availability of in person appointments. The patient expressed understanding and agreed to proceed.  Patient: Dawn Rivera   DOB: 1975/12/17   47 y.o. Female  MRN: [id number] Visit Date: 12/15/2022  Today's healthcare provider: Sherlyn Hay, DO   No chief complaint on file.  Subjective    HPI   HTN: Started BP meds and it has decreased SBP 120s.  Anxiety, Follow-up She was last seen for anxiety 6 weeks ago. Changes made at last visit include continued sertraline 200 mg daily, trazodone 100 mg nightly, and alprazolam 0.5 mg twice daily as needed, with instruction to try to reduce use of alprazolam.  - still taking alprazolam almost nightly  - having more stress with daughter, as she has had several lost pregnancies, since she got married last year.  - still having a lot of work stress. Feels she's in a good place overall.   She reports good compliance with treatment. She reports excellent tolerance of treatment. She is not having side effects.   She feels her anxiety is mild and moderate and Improved since last visit.  Symptoms: No chest pain No difficulty concentrating  No dizziness No fatigue  No feelings of losing control Yes insomnia - intermittent; trouble going back to sleep sometimes  No irritable No palpitations  No panic attacks No racing thoughts  No shortness of breath No sweating  No tremors/shakes    GAD-7 Results    12/15/2022    9:14 AM 11/03/2022    1:44 PM 06/29/2022   11:13 AM  GAD-7 Generalized Anxiety Disorder Screening Tool  1. Feeling Nervous, Anxious, or on Edge  1 1 1   2. Not Being Able to Stop or Control Worrying 1 0 1  3. Worrying Too Much About Different Things 1 0 1  4. Trouble Relaxing 3 1 2   5. Being So Restless it's Hard To Sit Still 0 0 0  6. Becoming Easily Annoyed or Irritable 1 0 1  7. Feeling Afraid As If Something Awful Might Happen 0 0 1  Total GAD-7 Score 7 2 7   Difficulty At Work, Home, or Getting  Along With Others?  Somewhat difficult Somewhat difficult    PHQ-9 Scores    11/03/2022    1:44 PM 06/29/2022   11:13 AM 12/29/2021    3:42 PM  PHQ9 SCORE ONLY  PHQ-9 Total Score 2 2 0    Medications: Outpatient Medications Prior to Visit  Medication Sig   Ascorbic Acid (VITAMIN C) 100 MG tablet Take 100 mg by mouth daily. Reported on 09/02/2015   clobetasol ointment (TEMOVATE) 0.05 % Apply twice daily to affected areas of psoriasis until smooth. Avoid applying to face, groin, and axilla.   doxycycline (MONODOX) 50 MG capsule Take 1 capsule by mouth daily with evening meal for flares. Do not lay down for 30 minutes after taking. Be cautious with sun exposure and use good sun protection while on this medication. Pregnant women should not take this medication.   lisinopril (ZESTRIL) 10 MG tablet Take 1 tablet (10 mg total) by mouth daily.   mometasone (  ELOCON) 0.1 % cream Apply 1 Application topically daily as needed (Rash). 5 times per week   Multiple Vitamin (MULTI-VITAMIN DAILY PO) Take by mouth.   pimecrolimus (ELIDEL) 1 % cream Apply twice daily to affected areas on face   sertraline (ZOLOFT) 100 MG tablet Take 1 tablet (100 mg total) by mouth 2 (two) times daily.   traZODone (DESYREL) 100 MG tablet Take 1 tablet (100 mg total) by mouth at bedtime as needed for sleep.   Triamcinolone Acetonide (NASACORT ALLERGY 24HR NA) Place into the nose.   [DISCONTINUED] ALPRAZolam (XANAX) 0.5 MG tablet Take 1 tablet (0.5 mg total) by mouth 2 (two) times daily as needed for anxiety.   [DISCONTINUED] valACYclovir (VALTREX) 1000 MG tablet Take 1  tablet (1,000 mg total) by mouth daily as needed. For symptoms. At flare take once daily for 5 days   [DISCONTINUED] cyanocobalamin ((VITAMIN B-12)) injection 1,000 mcg    No facility-administered medications prior to visit.        Objective    LMP 04/16/2015      Physical Exam Constitutional:      General: She is not in acute distress.    Appearance: Normal appearance.  HENT:     Head: Normocephalic.  Pulmonary:     Effort: Pulmonary effort is normal. No respiratory distress.  Neurological:     Mental Status: She is alert and oriented to person, place, and time. Mental status is at baseline.       Assessment & Plan    Anxiety and depression Assessment & Plan: GAD-7 score 7 Patient reports her anxiety does feel it is slightly worse, but that she is okay overall. Will continue sertraline 200 mg daily, trazodone 100 mg nightly as needed and alprazolam 0.5 mg twice daily as needed with plan to continue to try to reduce use of alprazolam. Patient last filled alprazolam 1 mg, 60 tablets, 09/18/2022 (old refill that was available to her at the pharmacy) had previously been dropped to 0.5 mg dose. No changes made today.  Orders: -     ALPRAZolam; Take 1 tablet (0.5 mg total) by mouth 2 (two) times daily as needed for anxiety.  Dispense: 60 tablet; Refill: 1  Insomnia, unspecified type Assessment & Plan: Continue trazodone 100 mg nightly as needed Continue alprazolam 0.5 mg twice daily as needed (patient is mostly using this at night) No changes made today   Fever blister Assessment & Plan: Refilled today. Patient takes as needed when experiencing high stress to prevent cold sores. Does not want suppressive therapy due to feeling she already has enough pills to take on a day-to-day basis.  Orders: -     valACYclovir HCl; Take 1 tablet (1,000 mg total) by mouth daily as needed. For symptoms. At flare take once daily for 5 days  Dispense: 90 tablet; Refill: 1  Primary  hypertension Assessment & Plan: Patient reports her home systolic blood pressures have been in the 120s and occasionally 119. Given potential for continued decrease in blood pressure over the coming months with current lisinopril dose, will not make any changes to it today. Check in 3 months. Patient to bring blood pressure cuff with her on return to validate effectiveness.    Return in about 3 months (around 03/17/2023).     I discussed the assessment and treatment plan with the patient. The patient was provided an opportunity to ask questions and all were answered. The patient agreed with the plan and demonstrated an understanding of the instructions.  The patient was advised to call back or seek an in-person evaluation if the symptoms worsen or if the condition fails to improve as anticipated.  I provided 30 minutes of virtual-face-to-face time during this encounter.   Sherlyn Hay, DO [facility] [phone] (phone) [phone] (fax)  [facility]

## 2022-12-15 NOTE — Assessment & Plan Note (Signed)
Refilled today. Patient takes as needed when experiencing high stress to prevent cold sores. Does not want suppressive therapy due to feeling she already has enough pills to take on a day-to-day basis.

## 2022-12-15 NOTE — Assessment & Plan Note (Signed)
Patient reports her home systolic blood pressures have been in the 120s and occasionally 119. Given potential for continued decrease in blood pressure over the coming months with current lisinopril dose, will not make any changes to it today. Check in 3 months. Patient to bring blood pressure cuff with her on return to validate effectiveness.

## 2022-12-15 NOTE — Assessment & Plan Note (Addendum)
GAD-7 score 7 Patient reports her anxiety does feel it is slightly worse, but that she is okay overall. Will continue sertraline 200 mg daily, trazodone 100 mg nightly as needed and alprazolam 0.5 mg twice daily as needed with plan to continue to try to reduce use of alprazolam. Patient last filled alprazolam 1 mg, 60 tablets, 09/18/2022 (old refill that was available to her at the pharmacy) had previously been dropped to 0.5 mg dose. No changes made today.

## 2022-12-15 NOTE — Assessment & Plan Note (Signed)
Continue trazodone 100 mg nightly as needed Continue alprazolam 0.5 mg twice daily as needed (patient is mostly using this at night) No changes made today

## 2022-12-26 ENCOUNTER — Ambulatory Visit
Admission: RE | Admit: 2022-12-26 | Discharge: 2022-12-26 | Disposition: A | Discharge status: Home / Self Care | Payer: 59 | Source: Ambulatory Visit | Attending: Family Medicine | Admitting: Family Medicine

## 2022-12-26 DIAGNOSIS — Z1231 Encounter for screening mammogram for malignant neoplasm of breast: Secondary | ICD-10-CM | POA: Insufficient documentation

## 2023-01-02 ENCOUNTER — Ambulatory Visit: Payer: 59 | Admitting: Dermatology

## 2023-01-04 ENCOUNTER — Other Ambulatory Visit: Payer: Self-pay | Admitting: Dermatology

## 2023-01-04 DIAGNOSIS — L710 Perioral dermatitis: Secondary | ICD-10-CM

## 2023-02-05 ENCOUNTER — Other Ambulatory Visit: Payer: Self-pay | Admitting: Dermatology

## 2023-02-05 DIAGNOSIS — L710 Perioral dermatitis: Secondary | ICD-10-CM

## 2023-02-07 ENCOUNTER — Other Ambulatory Visit: Payer: Self-pay | Admitting: Family Medicine

## 2023-02-07 DIAGNOSIS — I10 Essential (primary) hypertension: Secondary | ICD-10-CM

## 2023-03-21 ENCOUNTER — Other Ambulatory Visit: Payer: Self-pay | Admitting: Physician Assistant

## 2023-03-21 DIAGNOSIS — F32A Depression, unspecified: Secondary | ICD-10-CM

## 2023-03-22 NOTE — Telephone Encounter (Signed)
Requested medication (s) are due for refill today: yes  Requested medication (s) are on the active medication list: yes  Last refill:  12/15/22 #60 1 RF  Future visit scheduled: no  Notes to clinic:  med nor delegated to NT to RF   Requested Prescriptions  Pending Prescriptions Disp Refills   ALPRAZolam (XANAX) 0.5 MG tablet [Pharmacy Med Name: ALPRAZOLAM 0.5 MG TABLET] 60 tablet     Sig: TAKE 1 TABLET BY MOUTH 2 TIMES DAILY AS NEEDED FOR ANXIETY.     Not Delegated - Psychiatry: Anxiolytics/Hypnotics 2 Failed - 03/22/2023  8:12 AM      Failed - This refill cannot be delegated      Failed - Urine Drug Screen completed in last 360 days      Passed - Patient is not pregnant      Passed - Valid encounter within last 6 months    Recent Outpatient Visits           3 months ago Anxiety and depression   [place] [facility] [place], Monico Blitz, DO   4 months ago Anxiety and depression   [facility] [place], Monico Blitz, DO   8 months ago Anxiety and depression   [place] [facility] Alfredia Ferguson, PA-C   1 year ago Annual physical exam   [facility] Alfredia Ferguson, PA-C       Future Appointments             In 3 months Dawn Evener, MD [facility]

## 2023-03-23 ENCOUNTER — Other Ambulatory Visit: Payer: Self-pay | Admitting: Family Medicine

## 2023-03-23 DIAGNOSIS — F32A Depression, unspecified: Secondary | ICD-10-CM

## 2023-03-23 NOTE — Telephone Encounter (Signed)
Requested medication (s) are due for refill today: yes  Requested medication (s) are on the active medication list: yes  Last refill:  12/15/22  Future visit scheduled: yes  Notes to clinic:  Unable to refill per protocol, cannot delegate.      Requested Prescriptions  Pending Prescriptions Disp Refills   ALPRAZolam (XANAX) 0.5 MG tablet [Pharmacy Med Name: ALPRAZOLAM 0.5MG  TABLETS] 60 tablet     Sig: TAKE 1 TABLET(0.5 MG) BY MOUTH TWICE DAILY AS NEEDED FOR ANXIETY     Not Delegated - Psychiatry: Anxiolytics/Hypnotics 2 Failed - 03/23/2023  4:00 PM      Failed - This refill cannot be delegated      Failed - Urine Drug Screen completed in last 360 days      Passed - Patient is not pregnant      Passed - Valid encounter within last 6 months    Recent Outpatient Visits           3 months ago Anxiety and depression   Dawn Rivera, Dawn Blitz, Dawn Rivera   4 months ago Anxiety and depression   [name], Dawn Blitz, Dawn Rivera   8 months ago Anxiety and depression   [place] [facility] Alfredia Ferguson, PA-C   1 year ago Annual physical exam   [facility] Alfredia Ferguson, PA-C       Future Appointments             In 3 months Dawn Evener, Dawn Rivera [facility]

## 2023-05-12 ENCOUNTER — Other Ambulatory Visit: Payer: Self-pay | Admitting: Family Medicine

## 2023-05-12 DIAGNOSIS — F32A Depression, unspecified: Secondary | ICD-10-CM

## 2023-05-14 NOTE — Telephone Encounter (Signed)
 Requested medication (s) are due for refill today- yes  Requested medication (s) are on the active medication list -yes  Future visit scheduled -no  Last refill: 03/26/23 #60  Notes to clinic: non delegated Rx  Requested Prescriptions  Pending Prescriptions Disp Refills   ALPRAZolam (XANAX) 0.5 MG tablet [Pharmacy Med Name: ALPRAZOLAM 0.5 MG TABLET] 60 tablet 0    Sig: TAKE 1 TABLET BY MOUTH TWICE A DAY AS NEEDED FOR ANXIETY     Not Delegated - Psychiatry: Anxiolytics/Hypnotics 2 Failed - 05/14/2023 10:02 AM      Failed - This refill cannot be delegated      Failed - Urine Drug Screen completed in last 360 days      Passed - Patient is not pregnant      Passed - Valid encounter within last 6 months    Recent Outpatient Visits           5 months ago Anxiety and depression   [place] [facility] [place], Monico Blitz, DO   6 months ago Anxiety and depression   [place] [facility] [place], Monico Blitz, DO   10 months ago Anxiety and depression   [place] [facility] Alfredia Ferguson, PA-C   1 year ago Annual physical exam   [facility] Alfredia Ferguson, PA-C       Future Appointments             In 1 month Deirdre Evener, MD [place] [facility]               Requested Prescriptions  Pending Prescriptions Disp Refills   ALPRAZolam (XANAX) 0.5 MG tablet [Pharmacy Med Name: ALPRAZOLAM 0.5 MG TABLET] 60 tablet 0    Sig: TAKE 1 TABLET BY MOUTH TWICE A DAY AS NEEDED FOR ANXIETY     Not Delegated - Psychiatry: Anxiolytics/Hypnotics 2 Failed - 05/14/2023 10:02 AM      Failed - This refill cannot be delegated      Failed - Urine Drug Screen completed in last 360 days      Passed - Patient is not pregnant      Passed - Valid encounter within last 6 months    Recent Outpatient Visits           5 months ago Anxiety and depression   [address] [facility] [place],  Monico Blitz, DO   6 months ago Anxiety and depression   [place] [facility] [place], Monico Blitz, DO   10 months ago Anxiety and depression   [place] [facility] Alfredia Ferguson, PA-C   1 year ago Annual physical exam   [facility] Alfredia Ferguson, PA-C       Future Appointments             In 1 month Deirdre Evener, MD [facility]

## 2023-05-15 ENCOUNTER — Telehealth: Payer: Self-pay

## 2023-05-15 NOTE — Telephone Encounter (Signed)
 Mychart message sent.

## 2023-05-15 NOTE — Telephone Encounter (Signed)
 Copied from CRM [id number]. Topic: Clinical - Medication Refill >> May 15, 2023  2:05 PM Teressa P wrote: Reason for CRM: pt called asking for an appt with Jacquenette Shone for med refill on Xanax.   She scheduled one for April 8th but wants to know if she can get a refill while she is waiting for her appt.

## 2023-06-05 ENCOUNTER — Encounter: Payer: Self-pay | Admitting: Family Medicine

## 2023-06-05 ENCOUNTER — Ambulatory Visit: Admitting: Family Medicine

## 2023-06-05 VITALS — BP 139/87 | HR 64 | Ht 63.0 in | Wt 145.7 lb

## 2023-06-05 DIAGNOSIS — Z1159 Encounter for screening for other viral diseases: Secondary | ICD-10-CM

## 2023-06-05 DIAGNOSIS — Z09 Encounter for follow-up examination after completed treatment for conditions other than malignant neoplasm: Secondary | ICD-10-CM

## 2023-06-05 DIAGNOSIS — N182 Chronic kidney disease, stage 2 (mild): Secondary | ICD-10-CM

## 2023-06-05 DIAGNOSIS — I10 Essential (primary) hypertension: Secondary | ICD-10-CM | POA: Diagnosis not present

## 2023-06-05 DIAGNOSIS — F419 Anxiety disorder, unspecified: Secondary | ICD-10-CM

## 2023-06-05 DIAGNOSIS — Z23 Encounter for immunization: Secondary | ICD-10-CM

## 2023-06-05 DIAGNOSIS — F32A Depression, unspecified: Secondary | ICD-10-CM

## 2023-06-05 DIAGNOSIS — G4700 Insomnia, unspecified: Secondary | ICD-10-CM | POA: Diagnosis not present

## 2023-06-05 DIAGNOSIS — Z13220 Encounter for screening for lipoid disorders: Secondary | ICD-10-CM

## 2023-06-05 MED ORDER — TRAZODONE HCL 100 MG PO TABS: 100.0000 mg | ORAL_TABLET | Freq: Every evening | ORAL | 3 refills | Status: AC | PRN

## 2023-06-05 MED ORDER — LISINOPRIL 10 MG PO TABS: 10.0000 mg | ORAL_TABLET | Freq: Every day | ORAL | 3 refills | Status: AC

## 2023-06-05 MED ORDER — ALPRAZOLAM 0.5 MG PO TABS: ORAL_TABLET | ORAL | 1 refills | Status: DC

## 2023-06-05 MED ORDER — SERTRALINE HCL 100 MG PO TABS: 100.0000 mg | ORAL_TABLET | Freq: Two times a day (BID) | ORAL | 3 refills | Status: AC

## 2023-06-05 NOTE — Progress Notes (Signed)
 Established patient visit   Patient: Dawn Rivera   DOB: [birth year]   48 y.o. Female  MRN: [id number] Visit Date: 06/05/2023  Today's healthcare provider: Sherlyn Hay, DO   Chief Complaint  Patient presents with   Medication Refill   Subjective    HPI Dawn Rivera is a 48 year old female who presents for medication refill and pharmacy consolidation.  She is seeking to consolidate her prescriptions to one pharmacy, [facility] for [facility]. She consistently takes sertraline twice daily and trazodone 100 mg nightly, but is low on trazodone and requires a refill. She also uses over-the-counter Nasacort, vitamin C, and a multivitamin sporadically, and applies Elidel for dermatological issues.  She has a history of hypertension and previously used lisinopril, which was discontinued for unclear reasons, possibly relating to not wanting an additional medication. Her blood pressure was 128/78 mmHg last week at the dentist and typically ranges from the high 120s to 130s at home. She is not currently on any antihypertensive medication. She has been advised to lose five pounds and has started walking a couple of days a week, though she has not yet achieved weight loss.  She became a grandmother to a little girl born on January 21st and sees her granddaughter about three times a week. She has not had blood work in about a year and is due for it. She has not had a tetanus vaccine in the last ten years and is unsure about her screening history for hepatitis C and HIV. She has no known vitamin D deficiency and takes vitamin D sporadically.   She takes Xanax once or twice a day, more often at night, and sometimes during the day, noting increased use during stressful periods, such as when her father was in rehab. She has been adjusting to a lower dosage of Xanax.    She no longer needs pap smears per Dr. Toni Amend Cherry's note 06/01/2020 "Last Pap: 03/07/2016  (vaginal pap). Results were: normal.  Notes h/o abnormal pap smears, h/o LEEP.  No longer needed as she is s/p hysterectomy. "     Medications: Outpatient Medications Prior to Visit  Medication Sig   Ascorbic Acid (VITAMIN C) 100 MG tablet Take 100 mg by mouth daily. Reported on 09/02/2015   clobetasol ointment (TEMOVATE) 0.05 % Apply twice daily to affected areas of psoriasis until smooth. Avoid applying to face, groin, and axilla.   doxycycline (MONODOX) 50 MG capsule Take 1 capsule by mouth daily with evening meal for flares. Do not lay down for 30 minutes after taking. Be cautious with sun exposure and use good sun protection while on this medication. Pregnant women should not take this medication.   mometasone (ELOCON) 0.1 % cream Apply 1 Application topically daily as needed (Rash). 5 times per week   Multiple Vitamin (MULTI-VITAMIN DAILY PO) Take by mouth.   pimecrolimus (ELIDEL) 1 % cream Apply twice daily to affected areas on face   valACYclovir (VALTREX) 1000 MG tablet Take 1 tablet (1,000 mg total) by mouth daily as needed. For symptoms. At flare take once daily for 5 days   [DISCONTINUED] ALPRAZolam (XANAX) 0.5 MG tablet TAKE 1 TABLET BY MOUTH TWICE A DAY AS NEEDED FOR ANXIETY.  NEED APPOINTMENT.   [DISCONTINUED] lisinopril (ZESTRIL) 10 MG tablet Take 1 tablet (10 mg total) by mouth daily.   [DISCONTINUED] sertraline (ZOLOFT) 100 MG tablet Take 1 tablet (100 mg total) by mouth 2 (two)  times daily.   [DISCONTINUED] traZODone (DESYREL) 100 MG tablet Take 1 tablet (100 mg total) by mouth at bedtime as needed for sleep.   [DISCONTINUED] Triamcinolone Acetonide (NASACORT ALLERGY 24HR NA) Place into the nose.   No facility-administered medications prior to visit.    Review of Systems  Constitutional:  Negative for appetite change, chills, fatigue and fever.  Respiratory:  Negative for chest tightness and shortness of breath.   Cardiovascular:  Negative for chest pain and palpitations.   Gastrointestinal:  Negative for abdominal pain, nausea and vomiting.  Endocrine: Negative for polydipsia, polyphagia and polyuria.  Neurological:  Negative for dizziness, weakness and headaches.        Objective    BP 139/87 (BP Location: Left Arm, Patient Position: Sitting, Cuff Size: Normal)   Pulse 64   Ht 5\' 3"  (1.6 m)   Wt 145 lb 11.2 oz (66.1 kg)   LMP 04/16/2015   SpO2 100%   BMI 25.81 kg/m     Physical Exam Vitals and nursing note reviewed.  Constitutional:      General: She is not in acute distress.    Appearance: Normal appearance.  HENT:     Head: Normocephalic and atraumatic.  Eyes:     General: No scleral icterus.    Conjunctiva/sclera: Conjunctivae normal.  Cardiovascular:     Rate and Rhythm: Normal rate.  Pulmonary:     Effort: Pulmonary effort is normal.  Neurological:     Mental Status: She is alert and oriented to person, place, and time. Mental status is at baseline.  Psychiatric:        Mood and Affect: Mood normal.        Behavior: Behavior normal.      No results found for any visits on 06/05/23.  Assessment & Plan    Anxiety and depression -     Sertraline HCl; Take 1 tablet (100 mg total) by mouth 2 (two) times daily.  Dispense: 180 tablet; Refill: 3 -     ALPRAZolam; TAKE 1 TABLET BY MOUTH TWICE A DAY AS NEEDED FOR ANXIETY.  NEED APPOINTMENT.  Dispense: 60 tablet; Refill: 1  Follow-up exam, 3-6 months since previous exam  Primary hypertension -     Lisinopril; Take 1 tablet (10 mg total) by mouth daily.  Dispense: 90 tablet; Refill: 3  Insomnia, unspecified type -     traZODone HCl; Take 1 tablet (100 mg total) by mouth at bedtime as needed for sleep.  Dispense: 90 tablet; Refill: 3  Chronic kidney disease, stage 2, mildly decreased GFR -     Microalbumin / creatinine urine ratio -     Comprehensive metabolic panel with GFR -     VITAMIN D 25 Hydroxy (Vit-D Deficiency, Fractures)  Need for Tdap vaccination -     Tdap vaccine  greater than or equal to 7yo IM  Screening for lipid disorders -     Lipid panel  Encounter for hepatitis C screening test for low risk patient -     HCV Ab w Reflex to Quant PCR     Primary hypertension Blood pressure in high 130s to low 140s systolic. Lisinopril recommended to prevent kidney damage. - Restart lisinopril; sent to mail order pharmacy. - Monitor blood pressure daily. - Consider alternative medication if side effects occur.  Chronic Kidney Disease (CKD) Stage 2 Early-stage CKD with no symptoms. Avoid NSAIDs to prevent further damage. Monitor kidney function, especially with hypertension management. - Monitor kidney function regularly. -  Avoid NSAIDs such as Aleve and ibuprofen. - Encourage consistent vitamin D intake.  Anxiety and depression; medication Management [facility] and [facility] for long-term medications. Stable on sertraline and trazodone. Xanax used more at night, limited by controlled substance regulations. - Send sertraline and trazodone prescriptions [facility]. - Send Xanax prescription with a later start date. - Discussed Xanax prescription limitations.  Follow-up exam, 3 to 6 months since previous exam Due for tetanus vaccine. Hepatitis C and HIV screenings recommended. Sporadic vitamin D intake. - Administer tetanus vaccine. - Order hepatitis C screenings. - Encourage consistent vitamin D intake.   Follow-up Anxiety related to frequent visits. Prefers virtual visits to reduce anxiety. - Schedule follow-up in 3 months in person for CPE and to recheck blood pressure, with a virtual visit option for the following visit. - Check blood pressure daily and bring readings to the next visit. - Bring blood pressure cuff to the next visit for comparison.  Return in about 3 months (around 09/04/2023) for CPE, HTN.      I discussed the assessment and treatment plan with the patient   The patient was provided an opportunity to ask questions and all were answered. The patient agreed with the plan and demonstrated an understanding of the instructions.   The patient was advised to call back or seek an in-person evaluation if the symptoms worsen or if the condition fails to improve as anticipated.    Sherlyn Hay, DO  [facility] [phone] (phone) [phone] (fax)  [facility]

## 2023-06-05 NOTE — Patient Instructions (Addendum)
   Check your blood pressure once daily in the two weeks prior to your next visit. Otherwise, check your blood pressure a couple times per week, and any time you have concerning symptoms like headache, chest pain, dizziness, shortness of breath, or vision changes.   Our goal is less than 130/80.  To appropriately check your blood pressure, make sure you do the following:  1) Avoid caffeine, exercise, or tobacco products for 30 minutes before checking. Empty your bladder. 2) Sit with your back supported in a flat-backed chair. Rest your arm on something flat (arm of the chair, table, etc). 3) Sit still with your feet flat on the floor, resting, for at least 5 minutes.  4) Check your blood pressure. Take 1-2 readings.  5) Write down these readings and bring with you to any provider appointments.  **Bring your home blood pressure machine with you** to a provider's office for accuracy comparison at least once a year.   Make sure you take your blood pressure medications before you come to any office visit, even if you were asked to fast for labs.

## 2023-06-06 LAB — COMPREHENSIVE METABOLIC PANEL WITH GFR
ALT: 26 IU/L (ref 0–32)
AST: 25 IU/L (ref 0–40)
Albumin: 5 g/dL — ABNORMAL HIGH (ref 3.9–4.9)
Alkaline Phosphatase: 104 IU/L (ref 44–121)
BUN/Creatinine Ratio: 14 (ref 9–23)
BUN: 14 mg/dL (ref 6–24)
Bilirubin Total: 0.3 mg/dL (ref 0.0–1.2)
CO2: 22 mmol/L (ref 20–29)
Calcium: 10.3 mg/dL — ABNORMAL HIGH (ref 8.7–10.2)
Chloride: 101 mmol/L (ref 96–106)
Creatinine, Ser: 1.01 mg/dL — ABNORMAL HIGH (ref 0.57–1.00)
Globulin, Total: 2.8 g/dL (ref 1.5–4.5)
Glucose: 88 mg/dL (ref 70–99)
Potassium: 4.5 mmol/L (ref 3.5–5.2)
Sodium: 140 mmol/L (ref 134–144)
Total Protein: 7.8 g/dL (ref 6.0–8.5)
eGFR: 68 mL/min/{1.73_m2} (ref >59)

## 2023-06-06 LAB — MICROALBUMIN / CREATININE URINE RATIO
Creatinine, Urine: 40 mg/dL
Microalb/Creat Ratio: <8 mg/g{creat} (ref 0–29)
Microalbumin, Urine: <3 ug/mL

## 2023-06-06 LAB — HCV INTERPRETATION

## 2023-06-06 LAB — LIPID PANEL
Chol/HDL Ratio: 3.5 ratio (ref 0.0–4.4)
Cholesterol, Total: 250 mg/dL — ABNORMAL HIGH (ref 100–199)
HDL: 71 mg/dL (ref >39)
LDL Chol Calc (NIH): 162 mg/dL — ABNORMAL HIGH (ref 0–99)
Triglycerides: 100 mg/dL (ref 0–149)
VLDL Cholesterol Cal: 17 mg/dL (ref 5–40)

## 2023-06-06 LAB — HCV AB W REFLEX TO QUANT PCR: HCV Ab: NONREACTIVE

## 2023-06-06 LAB — VITAMIN D 25 HYDROXY (VIT D DEFICIENCY, FRACTURES): Vit D, 25-Hydroxy: 47.2 ng/mL (ref 30.0–100.0)

## 2023-06-07 ENCOUNTER — Encounter: Payer: Self-pay | Admitting: Family Medicine

## 2023-06-08 ENCOUNTER — Telehealth: Payer: Self-pay | Admitting: Family Medicine

## 2023-06-08 DIAGNOSIS — G4700 Insomnia, unspecified: Secondary | ICD-10-CM

## 2023-06-08 DIAGNOSIS — F32A Depression, unspecified: Secondary | ICD-10-CM

## 2023-06-08 DIAGNOSIS — I10 Essential (primary) hypertension: Secondary | ICD-10-CM

## 2023-06-08 NOTE — Telephone Encounter (Signed)
 [facility]  sertraline (ZOLOFT) 100 MG tablet   & lisinopril (ZESTRIL) 10 MG tablet   & traZODone (DESYREL) 100 MG tablet  Please advise

## 2023-06-09 ENCOUNTER — Other Ambulatory Visit: Payer: Self-pay | Admitting: Medical Genetics

## 2023-06-11 ENCOUNTER — Other Ambulatory Visit: Payer: Self-pay

## 2023-06-11 DIAGNOSIS — L710 Perioral dermatitis: Secondary | ICD-10-CM

## 2023-06-11 MED ORDER — DOXYCYCLINE MONOHYDRATE 50 MG PO CAPS: ORAL_CAPSULE | ORAL | 0 refills | Status: AC

## 2023-06-11 NOTE — Progress Notes (Signed)
 [facility] fax request. aw

## 2023-06-14 ENCOUNTER — Other Ambulatory Visit

## 2023-06-14 ENCOUNTER — Encounter: Payer: Self-pay | Admitting: Family Medicine

## 2023-06-17 ENCOUNTER — Other Ambulatory Visit: Payer: Self-pay | Admitting: Family Medicine

## 2023-06-17 DIAGNOSIS — F419 Anxiety disorder, unspecified: Secondary | ICD-10-CM

## 2023-06-18 ENCOUNTER — Other Ambulatory Visit
Admission: RE | Admit: 2023-06-18 | Discharge: 2023-06-18 | Disposition: A | Discharge status: Home / Self Care | Payer: Self-pay | Source: Ambulatory Visit | Attending: Medical Genetics | Admitting: Medical Genetics

## 2023-06-26 LAB — GENECONNECT MOLECULAR SCREEN: Genetic Analysis Overall Interpretation: NEGATIVE

## 2023-07-03 ENCOUNTER — Encounter: Payer: Self-pay | Admitting: Dermatology

## 2023-07-03 ENCOUNTER — Ambulatory Visit: Payer: 59 | Admitting: Dermatology

## 2023-07-03 DIAGNOSIS — L408 Other psoriasis: Secondary | ICD-10-CM | POA: Diagnosis not present

## 2023-07-03 DIAGNOSIS — Z79899 Other long term (current) drug therapy: Secondary | ICD-10-CM | POA: Diagnosis not present

## 2023-07-03 DIAGNOSIS — L710 Perioral dermatitis: Secondary | ICD-10-CM | POA: Diagnosis not present

## 2023-07-03 DIAGNOSIS — L409 Psoriasis, unspecified: Secondary | ICD-10-CM

## 2023-07-03 DIAGNOSIS — Z7189 Other specified counseling: Secondary | ICD-10-CM

## 2023-07-03 DIAGNOSIS — L988 Other specified disorders of the skin and subcutaneous tissue: Secondary | ICD-10-CM

## 2023-07-03 MED ORDER — CLOBETASOL PROPIONATE 0.05 % EX OINT: TOPICAL_OINTMENT | CUTANEOUS | 1 refills | Status: DC

## 2023-07-03 MED ORDER — PIMECROLIMUS 1 % EX CREA
TOPICAL_CREAM | CUTANEOUS | 11 refills | Status: DC
Start: 2023-07-03 — End: 2023-09-17

## 2023-07-03 MED ORDER — ZORYVE 0.3 % EX CREA: 1.0000 | TOPICAL_CREAM | CUTANEOUS | 11 refills | Status: DC

## 2023-07-03 NOTE — Progress Notes (Signed)
 Follow-Up Visit   Subjective  Dawn Rivera is a 48 y.o. female who presents for the following: Perioral derm 9m f/u, Elidel  prn, Doxycycline  50mg  1 po qd prn, Psoriasis elbow, R ankle clobetasol  ointment, Facial elastosis, pt presents for botox The patient has spots, moles and lesions to be evaluated, some may be new or changing and the patient may have concern these could be cancer.  The following portions of the chart were reviewed this encounter and updated as appropriate: medications, allergies, medical history  Review of Systems:  No other skin or systemic complaints except as noted in HPI or Assessment and Plan.  Objective  Well appearing patient in no apparent distress; mood and affect are within normal limits.  A focused examination was performed of the following areas: face  Relevant exam findings are noted in the Assessment and Plan.  Injection map photo    Assessment & Plan   PSORIASIS with INVERSE PSORIASIS  L elbow, R ankle, groin Exam: pink patches L elbow, R ankle 5% BSA. Chronic and persistent condition with duration or expected duration over one year. Condition is symptomatic/ bothersome to patient. Not currently at goal.  patient denies joint pain  Psoriasis is a chronic non-curable, but treatable genetic/hereditary disease that may have other systemic features affecting other organ systems such as joints (Psoriatic Arthritis). It is associated with an increased risk of inflammatory bowel disease, heart disease, non-alcoholic fatty liver disease, and depression.  Treatments include light and laser treatments; topical medications; and systemic medications including oral and injectables.  Treatment Plan: Start Zoryve cr qd/bid 7d/wk to elbows, leg, groin Decrease Clobetasol  oint to 3d/wk to leg and elbow prn flares, avoid face, groin, axilla  Topical steroids (such as triamcinolone , fluocinolone, fluocinonide, mometasone , clobetasol , halobetasol,  betamethasone, hydrocortisone) can cause thinning and lightening of the skin if they are used for too long in the same area. Your physician has selected the right strength medicine for your problem and area affected on the body. Please use your medication only as directed by your physician to prevent side effects.   Long term medication management.  Patient is using long term (months to years) prescription medication  to control their dermatologic condition.  These medications require periodic monitoring to evaluate for efficacy and side effects and may require periodic laboratory monitoring.   PERIORAL DERMATITIS face Exam: face clear today Perioral dermatitis is an eruption which is usually located around the mouth and nose.  It can be a rash and/or red bumps.  It occasionally occurs around the eyes.  It may be itchy and may burn.  The exact cause is unknown.  Some types of makeup, moisturizers, dental products, and prescription creams may be partially responsible for the eruption.  Topical steroids such as cortisone creams can temporarily make the rash better but with discontinuation the rash tends to recur and worsen, so they should be avoided. Topical antibiotics, elidel  cream, protopic ointment, and oral antibiotics may be prescribed to treat this condition.  Although perioral dermatitis is not an infection, some antibiotics have anti-inflammatory properties that help it greatly. Treatment Plan: Cont Elidel  cr qd/bid aa face prn flares Long term medication management.  Patient is using long term (months to years) prescription medication  to control their dermatologic condition.  These medications require periodic monitoring to evaluate for efficacy and side effects and may require periodic laboratory monitoring.  FACIAL ELASTOSIS face Exam: Rhytides and volume loss. Treatment Plan: Facial Elastosis Botox 27.5 units injected today to: -  Frown complex 20 units - Forehead 5 units - Under eyes  1.25 units x 2  Discussed adding botox to bil crow's feet at f/u, 5 units per side  Location: frown complex, forehead, under eyes  Informed consent: Discussed risks (infection, pain, bleeding, bruising, swelling, allergic reaction, paralysis of nearby muscles, eyelid droop, double vision, neck weakness, difficulty breathing, headache, undesirable cosmetic result, and need for additional treatment) and benefits of the procedure, as well as the alternatives.  Informed consent was obtained.  Preparation: The area was cleansed with alcohol.  Procedure Details:  Botox was injected into the dermis with a 30-gauge needle. Pressure applied to any bleeding. Ice packs offered for swelling.  Lot Number:  [id number] Expiration:  03/2025  Total Units Injected:  27.5  Plan: Tylenol  may be used for headache.  Allow 2 weeks before returning to clinic for additional dosing as needed. Patient will call for any problems.   Recommend daily broad spectrum sunscreen SPF 30+ to sun-exposed areas, reapply every 2 hours as needed. Call for new or changing lesions.  Staying in the shade or wearing long sleeves, sun glasses (UVA+UVB protection) and wide brim hats (4-inch brim around the entire circumference of the hat) are also recommended for sun protection.   PSORIASIS   Related Medications mometasone  (ELOCON ) 0.1 % cream Apply 1 Application topically daily as needed (Rash). 5 times per week clobetasol  ointment (TEMOVATE ) 0.05 % qd 3 days a week to affected areas of psoriasis on arms, legs prn flares, avoid face, groin, axilla PERIORIFICIAL DERMATITIS   Related Medications doxycycline  (MONODOX ) 50 MG capsule Take 1 capsule by mouth daily with evening meal for flares. Do not lay down for 30 minutes after taking. Be cautious with sun exposure and use good sun protection while on this medication. Pregnant women should not take this medication. pimecrolimus  (ELIDEL ) 1 % cream Apply twice daily to affected areas  on face as needed for dermatitis  Return in about 6 months (around 01/03/2024) for Botox , Psoriasis f/u, Perioral derm f/u.  I, Rollie Clipper, RMA, am acting as scribe for Celine Collard, MD .   Documentation: I have reviewed the above documentation for accuracy and completeness, and I agree with the above.  Celine Collard, MD

## 2023-07-03 NOTE — Patient Instructions (Addendum)
 Psoriasis, arms, legs, groin Zoryve 0.3% cream one to two times daily to arms, legs, groin as needed for flares Clobetasol  ointment once a day 3 days a week as needed for flares to arms and legs only, avoid using on face, under arms, and in groin     Due to recent changes in healthcare laws, you may see results of your pathology and/or laboratory studies on MyChart before the doctors have had a chance to review them. We understand that in some cases there may be results that are confusing or concerning to you. Please understand that not all results are received at the same time and often the doctors may need to interpret multiple results in order to provide you with the best plan of care or course of treatment. Therefore, we ask that you please give us  2 business days to thoroughly review all your results before contacting the office for clarification. Should we see a critical lab result, you will be contacted sooner.   If You Need Anything After Your Visit  If you have any questions or concerns for your doctor, please call our main line at [phone] and press option 4 to reach your doctor's medical assistant. If no one answers, please leave a voicemail as directed and we will return your call as soon as possible. Messages left after 4 pm will be answered the following business day.   You may also send us  a message via MyChart. We typically respond to MyChart messages within 1-2 business days.  For prescription refills, please ask your pharmacy to contact our office. Our fax number is [phone].  If you have an urgent issue when the clinic is closed that cannot wait until the next business day, you can page your doctor at the number below.    Please note that while we do our best to be available for urgent issues outside of office hours, we are not available 24/7.   If you have an urgent issue and are unable to reach us , you may choose to seek medical care at your doctor's office, retail  clinic, urgent care center, or emergency room.  If you have a medical emergency, please immediately call 911 or go to the emergency department.  Pager Numbers  - Dr. Bary Likes: [phone]  - Dr. Annette Barters: [phone]  - Dr. Felipe Horton: [phone]   In the event of inclement weather, please call our main line at [phone] for an update on the status of any delays or closures.  Dermatology Medication Tips: Please keep the boxes that topical medications come in in order to help keep track of the instructions about where and how to use these. Pharmacies typically print the medication instructions only on the boxes and not directly on the medication tubes.   If your medication is too expensive, please contact our office at [phone] option 4 or send us  a message through MyChart.   We are unable to tell what your co-pay for medications will be in advance as this is different depending on your insurance coverage. However, we may be able to find a substitute medication at lower cost or fill out paperwork to get insurance to cover a needed medication.   If a prior authorization is required to get your medication covered by your insurance company, please allow us  1-2 business days to complete this process.  Drug prices often vary depending on where the prescription is filled and some pharmacies may offer cheaper prices.  The website www.goodrx.com contains coupons for medications through  different pharmacies. The prices here do not account for what the cost may be with help from insurance (it may be cheaper with your insurance), but the website can give you the price if you did not use any insurance.  - You can print the associated coupon and take it with your prescription to the pharmacy.  - You may also stop by our office during regular business hours and pick up a GoodRx coupon card.  - If you need your prescription sent electronically to a different pharmacy, notify our office through [facility] or by phone at [phone] option 4.     Si Usted Necesita Algo Despus de Su Visita  Tambin puede enviarnos un mensaje a travs de Clinical cytogeneticist. Por lo general respondemos a los mensajes de MyChart en el transcurso de 1 a 2 das hbiles.  Para renovar recetas, por favor pida a su farmacia que se ponga en contacto con nuestra oficina. Franz Jacks de fax es [place] [phone].  Si tiene un asunto urgente cuando la clnica est cerrada y que no puede esperar hasta el siguiente da hbil, puede llamar/localizar a su doctor(a) al nmero que aparece a continuacin.   Por favor, tenga en cuenta que aunque hacemos todo lo posible para estar disponibles para asuntos urgentes fuera del horario de [place], no estamos disponibles las 24 horas del da, los 7 [address]  [address] de la [place].   Si tiene un problema urgente y no puede comunicarse con nosotros, puede optar por buscar atencin mdica  en el consultorio de su doctor(a), en una clnica privada, en un centro de atencin urgente o en una sala de emergencias.  Si tiene Engineer, drilling, por favor llame inmediatamente al 911 o vaya a la sala de emergencias.  Nmeros de bper  - Dr. Bary Likes: [phone]  - Dra. Annette Barters: [phone]  - Dr. Felipe Horton: [phone]   En caso de inclemencias del tiempo, por favor llame a Lajuan Pila principal al [phone] para una actualizacin sobre el [place] de cualquier retraso o cierre.  Consejos para la medicacin en dermatologa: Por favor, guarde las cajas en las que vienen los medicamentos de uso tpico para ayudarle a seguir las instrucciones sobre dnde y cmo usarlos. Las farmacias generalmente imprimen las instrucciones del medicamento slo en las cajas y no directamente en los tubos del [place].   Si su medicamento es muy caro, por favor, pngase en contacto con Bettyjane Brunet llamando al [phone] y presione la opcin 4 o envenos un mensaje a travs de Clinical cytogeneticist.   No podemos  decirle cul ser su copago por los medicamentos por adelantado ya que esto es diferente dependiendo de la cobertura de su seguro. Sin embargo, es posible que podamos encontrar un medicamento sustituto a Audiological scientist un formulario para que el seguro cubra el medicamento que se considera necesario.   Si se requiere una autorizacin previa para que su compaa de seguros [place] su medicamento, por favor permtanos de 1 a 2 das hbiles para completar este proceso.  Los precios de los medicamentos varan con frecuencia dependiendo del Environmental consultant de dnde se surte la receta y alguna farmacias pueden ofrecer precios ms baratos.  El sitio web www.goodrx.com tiene cupones para medicamentos de Health and safety inspector. Los precios aqu no tienen en cuenta lo que podra costar con la ayuda del seguro (puede ser ms barato con su seguro), pero el sitio web puede darle el precio si no utiliz Tourist information centre manager.  - Puede imprimir el cupn correspondiente y llevarlo  con su receta a la farmacia.  - Tambin puede pasar por nuestra oficina durante el horario de atencin regular y Education officer, museum una tarjeta de cupones de GoodRx.  - Si necesita que su receta se enve electrnicamente a una farmacia diferente, informe a nuestra oficina a travs de MyChart de  o por telfono llamando al [phone] y presione la opcin 4.

## 2023-09-04 ENCOUNTER — Ambulatory Visit (INDEPENDENT_AMBULATORY_CARE_PROVIDER_SITE_OTHER): Admitting: Family Medicine

## 2023-09-04 ENCOUNTER — Encounter: Payer: Self-pay | Admitting: Family Medicine

## 2023-09-04 VITALS — BP 133/81 | HR 72 | Resp 14 | Ht 63.0 in | Wt 145.2 lb

## 2023-09-04 DIAGNOSIS — F1010 Alcohol abuse, uncomplicated: Secondary | ICD-10-CM

## 2023-09-04 DIAGNOSIS — Z0001 Encounter for general adult medical examination with abnormal findings: Secondary | ICD-10-CM

## 2023-09-04 DIAGNOSIS — G4700 Insomnia, unspecified: Secondary | ICD-10-CM

## 2023-09-04 DIAGNOSIS — Z0000 Encounter for general adult medical examination without abnormal findings: Secondary | ICD-10-CM

## 2023-09-04 DIAGNOSIS — I10 Essential (primary) hypertension: Secondary | ICD-10-CM

## 2023-09-04 NOTE — Patient Instructions (Signed)
 Recommend getting the hepatitis B

## 2023-09-04 NOTE — Progress Notes (Signed)
 Complete physical exam   Patient: Dawn Rivera   DOB: [birth year]   48 y.o. Female  MRN: [id number] Visit Date: 09/04/2023  Today's healthcare provider: LAURAINE LOISE BUOY, DO   Chief Complaint  Patient presents with   Annual Exam    Sleeping pattern: last few weeks not sleeping much due to hot or cold Exercising: Some walking, swimming   Hypertension   Subjective    Dawn Rivera is a 48 y.o. female who presents today for a complete physical exam.    Hypertension Pertinent negatives include no chest pain, headaches or shortness of breath.   HPI     Annual Exam    Additional comments: Sleeping pattern: last few weeks not sleeping much due to hot or cold Exercising: Some walking, swimming      Last edited by Wilfred Hargis RAMAN, CMA on 09/04/2023  3:50 PM.       Dawn Rivera is a 48 year old female who presents for an annual physical exam.  She experiences difficulty sleeping, characterized by frequent awakenings due to feeling hot and experiencing night sweats. These symptoms have persisted for at least a year. She has been using Xanax  at night, having previously reduced her intake, but has taken it the last several nights to aid sleep. The insomnia is not severe enough to cause major stress.  Her blood pressure readings at home are generally 110s-120s/70s mmHg after calming down from daily activities. She monitors her blood pressure regularly.  She consumes alcohol about three nights a week, typically four beers, and occasionally a shot of tequila. On one day a week, she may consume five or six beers and a couple of shots. She denies any inability to stop drinking once started, needing a drink in the morning, or failing to meet responsibilities due to drinking. She reports feeling guilt or remorse after drinking maybe once in the past year.  She experiences occasional left-sided abdominal pain, described as cramp-like. The pain occurs a couple of times a month and  usually resolves within a day. Her grandmother had diverticulosis.  She reports frequent urination, which she attributes to age, and generally loose stools.     Past Medical History:  Diagnosis Date   Anxiety    Cancer (HCC)    cervical   Depression    HPV (human papilloma virus) anogenital infection    Miscarriage    Vaginal Pap smear, abnormal    Past Surgical History:  Procedure Laterality Date   ABDOMINAL HYSTERECTOMY     AUGMENTATION MAMMAPLASTY Bilateral    breast implants   BREAST ENHANCEMENT SURGERY Bilateral    replaced in Nov 2022, saline   COSMETIC SURGERY     CYSTOSCOPY  05/24/2015   Procedure: CYSTOSCOPY;  Surgeon: Gladis DELENA Dollar, MD;  Location: [facility];  Service: Gynecology;;   LAPAROSCOPIC VAGINAL HYSTERECTOMY WITH SALPINGECTOMY Bilateral 05/24/2015   Procedure: LAPAROSCOPIC ASSISTED VAGINAL HYSTERECTOMY WITH BILATERAL SALPINGECTOMY;  Surgeon: Gladis DELENA Dollar, MD;  Location: [facility];  Service: Gynecology;  Laterality: Bilateral;   LEEP N/A 04/12/2015   Procedure: LOOP ELECTROSURGICAL EXCISION PROCEDURE (LEEP);  Surgeon: Gladis DELENA Dollar, MD;  Location: [facility];  Service: Gynecology;  Laterality: N/A;   Social History   Socioeconomic History   Marital status: Married    Spouse name: Not on file   Number of children: Not on file   Years of education: Not on file   Highest education level: Bachelor's degree (e.g., BA, AB, BS)  Occupational History   Not on file  Tobacco Use   Smoking status: Never   Smokeless tobacco: Never  Vaping Use   Vaping status: Never Used  Substance and Sexual Activity   Alcohol use: Yes    Comment: occas   Drug use: No   Sexual activity: Yes    Birth control/protection: Surgical  Other Topics Concern   Not on file  Social History Narrative   Not on file   Social Drivers of Health   Financial Resource Strain: Low Risk  (06/04/2023)   Overall Financial Resource Strain (CARDIA)    Difficulty of Paying  Living Expenses: Not hard at all  Food Insecurity: No Food Insecurity (06/04/2023)   Hunger Vital Sign    Worried About Running Out of Food in the Last Year: Never true    Ran Out of Food in the Last Year: Never true  Transportation Needs: No Transportation Needs (06/04/2023)   PRAPARE - Administrator, Civil Service (Medical): No    Lack of Transportation (Non-Medical): No  Physical Activity: Insufficiently Active (06/04/2023)   Exercise Vital Sign    Days of Exercise per Week: 2 days    Minutes of Exercise per Session: 20 min  Stress: Stress Concern Present (06/04/2023)   [employer] of Occupational Health - Occupational Stress Questionnaire    Feeling of Stress : To some extent  Social Connections: Unknown (06/04/2023)   Social Connection and Isolation Panel    Frequency of Communication with Friends and Family: More than three times a week    Frequency of Social Gatherings with Friends and Family: Three times a week    Attends Religious Services: Patient declined    Active Member of Clubs or Organizations: Patient declined    Attends Banker Meetings: Not on file    Marital Status: Married  Catering manager Violence: Not on file   Family Status  Relation Name Status   Youth worker  (Not Specified)   Mother Mom (Not Specified)   MGM  (Not Specified)   MGF  (Not Specified)   Neg Hx  (Not Specified)  No partnership data on file   Family History  Problem Relation Age of Onset   Breast cancer Maternal Aunt    Heart disease Mother    Arthritis Mother    Asthma Mother    COPD Mother    Varicose Veins Mother    Colon cancer Maternal Grandmother    Ovarian cancer Maternal Grandfather    Diabetes Neg Hx    No Known Allergies  Patient Care Team: Ovie Eastep, Lauraine SAILOR, DO as PCP - General (Family Medicine)   Medications: Outpatient Medications Prior to Visit  Medication Sig   ALPRAZolam  (XANAX ) 0.5 MG tablet TAKE 1 TABLET BY MOUTH TWICE A DAY AS NEEDED FOR  ANXIETY.  NEED APPOINTMENT.   Ascorbic Acid (VITAMIN C) 100 MG tablet Take 100 mg by mouth daily. Reported on 09/02/2015   clobetasol  ointment (TEMOVATE ) 0.05 % qd 3 days a week to affected areas of psoriasis on arms, legs prn flares, avoid face, groin, axilla   doxycycline  (MONODOX ) 50 MG capsule Take 1 capsule by mouth daily with evening meal for flares. Do not lay down for 30 minutes after taking. Be cautious with sun exposure and use good sun protection while on this medication. Pregnant women should not take this medication.   lisinopril  (ZESTRIL ) 10 MG tablet Take 1 tablet (10 mg total) by mouth daily.   mometasone  (ELOCON )  0.1 % cream Apply 1 Application topically daily as needed (Rash). 5 times per week   Multiple Vitamin (MULTI-VITAMIN DAILY PO) Take by mouth.   pimecrolimus  (ELIDEL ) 1 % cream Apply twice daily to affected areas on face as needed for dermatitis   Roflumilast  (ZORYVE ) 0.3 % CREA Apply 1 Application topically as directed. qd to bid aa psoriasis on arms, legs, groin prn flares   sertraline  (ZOLOFT ) 100 MG tablet Take 1 tablet (100 mg total) by mouth 2 (two) times daily.   traZODone  (DESYREL ) 100 MG tablet Take 1 tablet (100 mg total) by mouth at bedtime as needed for sleep.   valACYclovir  (VALTREX ) 1000 MG tablet Take 1 tablet (1,000 mg total) by mouth daily as needed. For symptoms. At flare take once daily for 5 days   No facility-administered medications prior to visit.    Review of Systems  Constitutional:  Negative for chills, fatigue and fever.  HENT:  Negative for congestion, ear pain, rhinorrhea, sneezing and sore throat.   Eyes: Negative.  Negative for pain and redness.  Respiratory:  Negative for cough, shortness of breath and wheezing.   Cardiovascular:  Negative for chest pain and leg swelling.  Gastrointestinal:  Negative for abdominal pain, blood in stool, constipation, diarrhea and nausea.  Endocrine: Negative for polydipsia and polyphagia.  Genitourinary:  Negative.  Negative for dysuria, flank pain, hematuria, pelvic pain, vaginal bleeding and vaginal discharge.  Musculoskeletal:  Negative for arthralgias, back pain, gait problem and joint swelling.  Skin:  Negative for rash.  Neurological: Negative.  Negative for dizziness, tremors, seizures, weakness, light-headedness, numbness and headaches.  Hematological:  Negative for adenopathy.  Psychiatric/Behavioral: Negative.  Negative for behavioral problems, confusion and dysphoric mood. The patient is not nervous/anxious and is not hyperactive.       Objective    BP 133/81 Comment: Home BP cuff  Pulse 72   Resp 14   Ht 5' 3 (1.6 m)   Wt 145 lb 3.2 oz (65.9 kg)   LMP 04/16/2015   SpO2 100%   BMI 25.72 kg/m    Physical Exam Vitals and nursing note reviewed.  Constitutional:      General: She is awake.     Appearance: Normal appearance.  HENT:     Head: Normocephalic and atraumatic.     Right Ear: Tympanic membrane, ear canal and external ear normal.     Left Ear: Tympanic membrane, ear canal and external ear normal.     Nose: Nose normal.     Mouth/Throat:     Mouth: Mucous membranes are moist.     Pharynx: Oropharynx is clear. No oropharyngeal exudate or posterior oropharyngeal erythema.  Eyes:     General: No scleral icterus.    Extraocular Movements: Extraocular movements intact.     Conjunctiva/sclera: Conjunctivae normal.     Pupils: Pupils are equal, round, and reactive to light.  Neck:     Thyroid : No thyromegaly or thyroid  tenderness.  Cardiovascular:     Rate and Rhythm: Normal rate and regular rhythm.     Pulses: Normal pulses.     Heart sounds: Normal heart sounds.  Pulmonary:     Effort: Pulmonary effort is normal. No tachypnea, bradypnea or respiratory distress.     Breath sounds: Normal breath sounds. No stridor. No wheezing, rhonchi or rales.  Abdominal:     General: Bowel sounds are normal. There is no distension.     Palpations: Abdomen is soft. There is  no mass.  Tenderness: There is no abdominal tenderness. There is no guarding.     Hernia: No hernia is present.  Musculoskeletal:     Cervical back: Normal range of motion and neck supple.     Right lower leg: No edema.     Left lower leg: No edema.  Lymphadenopathy:     Cervical: No cervical adenopathy.  Skin:    General: Skin is warm and dry.  Neurological:     Mental Status: She is alert and oriented to person, place, and time. Mental status is at baseline.  Psychiatric:        Mood and Affect: Mood normal.        Behavior: Behavior normal.      Last depression screening scores    09/04/2023    3:51 PM 06/05/2023    8:08 AM 11/03/2022    1:44 PM  PHQ 2/9 Scores  PHQ - 2 Score 1 0 0  PHQ- 9 Score 4 3 2    Last fall risk screening    11/03/2022    1:44 PM  Fall Risk   Falls in the past year? 0  Number falls in past yr: 0  Injury with Fall? 0   Last Audit-C alcohol use screening    09/04/2023    4:23 PM  Alcohol Use Disorder Test (AUDIT)  1. How often do you have a drink containing alcohol? 3  2. How many drinks containing alcohol do you have on a typical day when you are drinking? 1  3. How often do you have six or more drinks on one occasion? 3  AUDIT-C Score 7  4. How often during the last year have you found that you were not able to stop drinking once you had started? 0  5. How often during the last year have you failed to do what was normally expected from you because of drinking? 0  6. How often during the last year have you needed a first drink in the morning to get yourself going after a heavy drinking session? 0  7. How often during the last year have you had a feeling of guilt of remorse after drinking? 1  8. How often during the last year have you been unable to remember what happened the night before because you had been drinking? 1  9. Have you or someone else been injured as a result of your drinking? 0  10. Has a relative or friend or a doctor or another  health worker been concerned about your drinking or suggested you cut down? 0  Alcohol Use Disorder Identification Test Final Score (AUDIT) 9   A score of 3 or more in women, and 4 or more in men indicates increased risk for alcohol abuse, EXCEPT if all of the points are from question 1   No results found for any visits on 09/04/23.  Assessment & Plan    Routine Health Maintenance and Physical Exam  Exercise Activities and Dietary recommendations  Goals   None     Immunization History  Administered Date(s) Administered   Influenza,inj,Quad PF,6+ Mos 02/02/2015   Tdap 06/05/2023    Health Maintenance  Topic Date Due   Hepatitis B Vaccines (1 of 3 - 19+ 3-dose series) Never done   COVID-19 Vaccine (1) 11/28/2023 (Originally 11/20/1980)   INFLUENZA VACCINE  09/28/2023   Fecal DNA (Cologuard)  12/07/2023   MAMMOGRAM  12/26/2023   DTaP/Tdap/Td (2 - Td or Tdap) 06/04/2033   Hepatitis C Screening  Completed   HPV VACCINES  Aged Out   Meningococcal B Vaccine  Aged Out   HIV Screening  Discontinued    Discussed health benefits of physical activity, and encouraged her to engage in regular exercise appropriate for her age and condition.   Annual physical exam  Insomnia, unspecified type  Primary hypertension  Alcohol consumption binge drinking      Annual physical exam Physical exam overall unremarkable except as noted above. Routine lab work ordered as noted. Has not received hepatitis B vaccine. Inconsistent with vitamin D  supplementation. Discussed benefits of hepatitis B vaccine. - Administer hepatitis B vaccine, considering a two-step or three-step series. - Encourage consistent intake of vitamin D  supplements.  Insomnia Experiences difficulty sleeping with frequent awakenings due to night sweats. Occasionally uses Xanax  but has reduced usage.  - Consider non-pharmacological interventions for sleep hygiene, such as reducing screen time before bed and using blue  light filters.  Hypertension Blood pressure well-managed with occasional elevations due to stress and activity. Aware of need for calm period measurements for accurate baseline. - Ensure blood pressure measurements are taken during calm periods. Home BP cuff validated today.  Alcohol consumption binge drinking Consumes alcohol three nights a week (4-5 alcoholic beverages each instance), with higher consumption on one day (up to 8 alcoholic beverages). Aware of potential liver health impact and recommended limits. Discussed resources for reducing alcohol consumption. - Discuss potential resources for reducing alcohol consumption, including counseling and medication. - Encourage limiting alcohol intake to no more than two drinks per day.     Return in about 1 year (around 09/03/2024) for CPE.     I discussed the assessment and treatment plan with the patient  The patient was provided an opportunity to ask questions and all were answered. The patient agreed with the plan and demonstrated an understanding of the instructions.   The patient was advised to call back or seek an in-person evaluation if the symptoms worsen or if the condition fails to improve as anticipated.    LAURAINE LOISE BUOY, DO  [facility] [phone] (phone) [phone] (fax)  [facility]

## 2023-09-11 DIAGNOSIS — F1010 Alcohol abuse, uncomplicated: Secondary | ICD-10-CM | POA: Insufficient documentation

## 2023-09-15 ENCOUNTER — Other Ambulatory Visit: Payer: Self-pay | Admitting: Dermatology

## 2023-09-15 DIAGNOSIS — L710 Perioral dermatitis: Secondary | ICD-10-CM

## 2023-09-15 DIAGNOSIS — L409 Psoriasis, unspecified: Secondary | ICD-10-CM

## 2023-10-06 ENCOUNTER — Other Ambulatory Visit: Payer: Self-pay | Admitting: Family Medicine

## 2023-10-06 DIAGNOSIS — F419 Anxiety disorder, unspecified: Secondary | ICD-10-CM

## 2024-01-08 ENCOUNTER — Encounter: Payer: Self-pay | Admitting: Dermatology

## 2024-01-08 ENCOUNTER — Ambulatory Visit (INDEPENDENT_AMBULATORY_CARE_PROVIDER_SITE_OTHER): Admitting: Dermatology

## 2024-01-08 DIAGNOSIS — L409 Psoriasis, unspecified: Secondary | ICD-10-CM

## 2024-01-08 DIAGNOSIS — Z79899 Other long term (current) drug therapy: Secondary | ICD-10-CM | POA: Diagnosis not present

## 2024-01-08 DIAGNOSIS — L710 Perioral dermatitis: Secondary | ICD-10-CM

## 2024-01-08 DIAGNOSIS — Z7189 Other specified counseling: Secondary | ICD-10-CM

## 2024-01-08 DIAGNOSIS — L988 Other specified disorders of the skin and subcutaneous tissue: Secondary | ICD-10-CM

## 2024-01-08 MED ORDER — ZORYVE 0.3 % EX CREA: 1.0000 | TOPICAL_CREAM | CUTANEOUS | 11 refills | Status: AC

## 2024-01-08 NOTE — Patient Instructions (Addendum)
 Psoriasis groin, elbow, legs Start Zoryve  cream daily Monday through Friday as needed for flares to groin, arms, legs  Decrease Clobetasol  ointment to 3 days a week Friday, Saturday, Sunday prn flares to elbow, legs avoid face, groin, underarms    Due to recent changes in healthcare laws, you may see results of your pathology and/or laboratory studies on MyChart before the doctors have had a chance to review them. We understand that in some cases there may be results that are confusing or concerning to you. Please understand that not all results are received at the same time and often the doctors may need to interpret multiple results in order to provide you with the best plan of care or course of treatment. Therefore, we ask that you please give us  2 business days to thoroughly review all your results before contacting the office for clarification. Should we see a critical lab result, you will be contacted sooner.   If You Need Anything After Your Visit  If you have any questions or concerns for your doctor, please call our main line at [phone] and press option 4 to reach your doctor's medical assistant. If no one answers, please leave a voicemail as directed and we will return your call as soon as possible. Messages left after 4 pm will be answered the following business day.   You may also send us  a message via MyChart. We typically respond to MyChart messages within 1-2 business days.  For prescription refills, please ask your pharmacy to contact our office. Our fax number is [phone].  If you have an urgent issue when the clinic is closed that cannot wait until the next business day, you can page your doctor at the number below.    Please note that while we do our best to be available for urgent issues outside of office hours, we are not available 24/7.   If you have an urgent issue and are unable to reach us , you may choose to seek medical care at your doctor's office, retail  clinic, urgent care center, or emergency room.  If you have a medical emergency, please immediately call 911 or go to the emergency department.  Pager Numbers  - Dr. Hester: [phone]  - Dr. Jackquline: [phone]  - Dr. Claudene: [phone]   - Dr. Raymund: [phone]  In the event of inclement weather, please call our main line at [phone] for an update on the status of any delays or closures.  Dermatology Medication Tips: Please keep the boxes that topical medications come in in order to help keep track of the instructions about where and how to use these. Pharmacies typically print the medication instructions only on the boxes and not directly on the medication tubes.   If your medication is too expensive, please contact our office at [phone] option 4 or send us  a message through MyChart.   We are unable to tell what your co-pay for medications will be in advance as this is different depending on your insurance coverage. However, we may be able to find a substitute medication at lower cost or fill out paperwork to get insurance to cover a needed medication.   If a prior authorization is required to get your medication covered by your insurance company, please allow us  1-2 business days to complete this process.  Drug prices often vary depending on where the prescription is filled and some pharmacies may offer cheaper prices.  The website www.goodrx.com contains coupons for medications through different pharmacies. The  prices here do not account for what the cost may be with help from insurance (it may be cheaper with your insurance), but the website can give you the price if you did not use any insurance.  - You can print the associated coupon and take it with your prescription to the pharmacy.  - You may also stop by our office during regular business hours and pick up a GoodRx coupon card.  - If you need your prescription sent electronically to a different pharmacy,  notify our office through [facility] or by phone at [phone] option 4.     Si Usted Necesita Algo Despus de Su Visita  Tambin puede enviarnos un mensaje a travs de Clinical Cytogeneticist. Por lo general respondemos a los mensajes de MyChart en el transcurso de 1 a 2 das hbiles.  Para renovar recetas, por favor pida a su farmacia que se ponga en contacto con nuestra oficina. Randi lakes de fax es [place] [phone].  Si tiene un asunto urgente cuando la clnica est cerrada y que no puede esperar hasta el siguiente da hbil, puede llamar/localizar a su doctor(a) al nmero que aparece a continuacin.   Por favor, tenga en cuenta que aunque hacemos todo lo posible para estar disponibles para asuntos urgentes fuera del horario de [place], no estamos disponibles las 24 horas del da, los 7 [address] de la [place].   Si tiene un problema urgente y no puede comunicarse con nosotros, puede optar por buscar atencin mdica  en el consultorio de su doctor(a), en una clnica privada, en un centro de atencin urgente o en una sala de emergencias.  Si tiene engineer, drilling, por favor llame inmediatamente al 911 o vaya a la sala de emergencias.  Nmeros de bper  - Dr. Hester: [phone]  - Dra. Jackquline: [phone]  - Dr. Claudene: [phone]  - Dra. Kitts: [phone]  En caso de inclemencias del [place], por favor llame a nuestra lnea principal al [phone] para una actualizacin sobre el estado de cualquier retraso o cierre.  Consejos para la medicacin en dermatologa: Por favor, guarde las cajas en las que vienen los medicamentos de uso tpico para ayudarle a seguir las instrucciones sobre dnde y cmo usarlos. Las farmacias generalmente imprimen las instrucciones del medicamento slo en las cajas y no directamente en los tubos del [place].   Si su medicamento es muy caro, por favor, pngase en contacto con landry rieger llamando al [phone] y presione la opcin 4 o  envenos un mensaje a travs de Clinical Cytogeneticist.   No podemos decirle cul ser su copago por los medicamentos por adelantado ya que esto es diferente dependiendo de la cobertura de su seguro. Sin embargo, es posible que podamos encontrar un medicamento sustituto a audiological scientist un formulario para que el seguro cubra el medicamento que se considera necesario.   Si se requiere una autorizacin previa para que su compaa de seguros cubra su medicamento, por favor permtanos de 1 a 2 das hbiles para completar este proceso.  Los precios de los medicamentos varan con frecuencia dependiendo del environmental consultant de dnde se surte la receta y alguna farmacias pueden ofrecer precios ms baratos.  El sitio web www.goodrx.com tiene cupones para medicamentos de health and safety inspector. Los precios aqu no tienen en cuenta lo que podra costar con la ayuda del seguro (puede ser ms barato con su seguro), pero el sitio web puede darle el precio si no utiliz tourist information centre manager.  - Puede imprimir el cupn correspondiente y  llevarlo con su receta a la farmacia.  - Tambin puede pasar por nuestra oficina durante el horario de atencin regular y education officer, museum una tarjeta de cupones de GoodRx.  - Si necesita que su receta se enve electrnicamente a una farmacia diferente, informe a nuestra oficina a travs de MyChart de [place] o por telfono llamando al [phone] y presione la opcin 4.

## 2024-01-08 NOTE — Progress Notes (Unsigned)
 `  Follow-Up Visit   Subjective  Dawn Rivera is a 48 y.o. female who presents for the following: Facial Elastosis, botox today, Psoriasis with Inverse Psoriasis 36m f/u, groin, L elbow, Zoryve  cr prn groin, Clobetasol  oint prn L elbow, Perioral dermatitis face, 17m f/u Elidel  cr prn flares  The following portions of the chart were reviewed this encounter and updated as appropriate: medications, allergies, medical history  Review of Systems:  No other skin or systemic complaints except as noted in HPI or Assessment and Plan.  Objective  Well appearing patient in no apparent distress; mood and affect are within normal limits.  A focused examination was performed of the following areas: Face,   Relevant exam findings are noted in the Assessment and Plan.  Injection map    Assessment & Plan   PSORIASIS with INVERSE PSORIASIS Groin, L elbow Exam: mild scale L elbow 2% BSA. Chronic and persistent condition with duration or expected duration over one year. Condition is improving with treatment but not currently at goal. Psoriasis is a chronic non-curable, but treatable genetic/hereditary disease that may have other systemic features affecting other organ systems such as joints (Psoriatic Arthritis). It is associated with an increased risk of inflammatory bowel disease, heart disease, non-alcoholic fatty liver disease, and depression.  Treatments include light and laser treatments; topical medications; and systemic medications including oral and injectables.  Treatment Plan: Start Zoryve  cr qd Monday through Friday prn flares groin, arms, legs  Decrease Clobetasol  oint qd 3d/wk Friday, Saturday, Sunday prn flares to elbow, legs avoid f/g/a (pt will call when she needs refills)  Topical steroids (such as triamcinolone , fluocinolone, fluocinonide, mometasone , clobetasol , halobetasol, betamethasone, hydrocortisone) can cause thinning and lightening of the skin if they are used for too  long in the same area. Your physician has selected the right strength medicine for your problem and area affected on the body. Please use your medication only as directed by your physician to prevent side effects.  Long term medication management.  Patient is using long term (months to years) prescription medication  to control their dermatologic condition.  These medications require periodic monitoring to evaluate for efficacy and side effects and may require periodic laboratory monitoring.   PERIORAL DERMATITIS face Exam: face clear today Chronic condition with duration or expected duration over one year. Currently well-controlled. Perioral dermatitis is an eruption which is usually located around the mouth and nose.  It can be a rash and/or red bumps.  It occasionally occurs around the eyes.  It may be itchy and may burn.  The exact cause is unknown.  Some types of makeup, moisturizers, dental products, and prescription creams may be partially responsible for the eruption.  Topical steroids such as cortisone creams can temporarily make the rash better but with discontinuation the rash tends to recur and worsen, so they should be avoided. Topical antibiotics, elidel  cream, protopic ointment, and oral antibiotics may be prescribed to treat this condition.  Although perioral dermatitis is not an infection, some antibiotics have anti-inflammatory properties that help it greatly.  Treatment Plan: Cont Elidel  cr qd/bid prn flares (pt will call when she needs refills)  Avoid applying topical steroids like hydrocortisone cream to facial rash Long term medication management.  Patient is using long term (months to years) prescription medication  to control their dermatologic condition.  These medications require periodic monitoring to evaluate for efficacy and side effects and may require periodic laboratory monitoring.    FACIAL ELASTOSIS Exam: Rhytides and volume  loss. Treatment Plan: Botox 27.5 units  injected today to: - Frown complex 20 units - Forehead 5 units - Under Eye 1.25 x 2  Location: frown complex, forehead, under eye  Informed consent: Discussed risks (infection, pain, bleeding, bruising, swelling, allergic reaction, paralysis of nearby muscles, eyelid droop, double vision, neck weakness, difficulty breathing, headache, undesirable cosmetic result, and need for additional treatment) and benefits of the procedure, as well as the alternatives.  Informed consent was obtained.  Preparation: The area was cleansed with alcohol.  Procedure Details:  Botox was injected into the dermis with a 30-gauge needle. Pressure applied to any bleeding. Ice packs offered for swelling.  Lot Number:  [id number] Expiration:  11/2025  Total Units Injected:  27.5  Plan: Tylenol  may be used for headache.  Allow 2 weeks before returning to clinic for additional dosing as needed. Patient will call for any problems.  Recommend daily broad spectrum sunscreen SPF 30+ to sun-exposed areas, reapply every 2 hours as needed. Call for new or changing lesions.  Staying in the shade or wearing long sleeves, sun glasses (UVA+UVB protection) and wide brim hats (4-inch brim around the entire circumference of the hat) are also recommended for sun protection.     Return in 6 months (on 07/07/2024) for botox, 1 yr psoriasis, perioral derm.  I, Grayce Saunas, RMA, am acting as scribe for Alm Rhyme, MD .   Documentation: I have reviewed the above documentation for accuracy and completeness, and I agree with the above.  Alm Rhyme, MD

## 2024-01-09 ENCOUNTER — Encounter: Payer: Self-pay | Admitting: Dermatology

## 2024-02-25 ENCOUNTER — Other Ambulatory Visit: Payer: Self-pay | Admitting: Family Medicine

## 2024-02-25 DIAGNOSIS — F32A Depression, unspecified: Secondary | ICD-10-CM

## 2024-03-13 ENCOUNTER — Encounter: Payer: Self-pay | Admitting: Family Medicine

## 2024-03-14 ENCOUNTER — Other Ambulatory Visit: Payer: Self-pay

## 2024-03-14 DIAGNOSIS — B001 Herpesviral vesicular dermatitis: Secondary | ICD-10-CM

## 2024-03-14 NOTE — Telephone Encounter (Signed)
 LOV 09/04/23 NOV 09/09/24 LRF 12/15/22 qty:90 r:1  Pt is requesting a refill on medication, please advise and review.

## 2024-03-17 MED ORDER — VALACYCLOVIR HCL 1 G PO TABS: 1000.0000 mg | ORAL_TABLET | Freq: Every day | ORAL | 1 refills | Status: AC | PRN

## 2024-07-08 ENCOUNTER — Ambulatory Visit: Admitting: Dermatology

## 2024-09-09 ENCOUNTER — Encounter: Admitting: Family Medicine
